# Patient Record
Sex: Female | Born: 1965 | Race: White | Hispanic: No | Marital: Married | Smoking: Former smoker
Health system: Southern US, Community
[De-identification: ages and names within clinical notes are randomized; demographics above are authoritative.]

## PROBLEM LIST (undated history)

## (undated) DIAGNOSIS — E041 Nontoxic single thyroid nodule: Secondary | ICD-10-CM

## (undated) DIAGNOSIS — M549 Dorsalgia, unspecified: Secondary | ICD-10-CM

## (undated) DIAGNOSIS — Z5189 Encounter for other specified aftercare: Secondary | ICD-10-CM

## (undated) HISTORY — PX: TONSILLECTOMY: SUR1361

## (undated) HISTORY — PX: TUBAL LIGATION: SHX77

## (undated) HISTORY — PX: COSMETIC SURGERY: SHX468

## (undated) HISTORY — PX: LUMBAR SPINE SURGERY: SHX701

## (undated) HISTORY — PX: SPINE SURGERY: SHX786

## (undated) HISTORY — PX: DILATION AND CURETTAGE OF UTERUS: SHX78

## (undated) HISTORY — DX: Encounter for other specified aftercare: Z51.89

## (undated) HISTORY — PX: CERVICAL SPINE SURGERY: SHX589

---
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
## 2007-11-04 ENCOUNTER — Ambulatory Visit (HOSPITAL_COMMUNITY): Admission: RE | Admit: 2007-11-04 | Discharge: 2007-11-04 | Payer: Self-pay | Admitting: Obstetrics & Gynecology

## 2007-11-04 ENCOUNTER — Encounter (INDEPENDENT_AMBULATORY_CARE_PROVIDER_SITE_OTHER): Payer: Self-pay | Admitting: Obstetrics & Gynecology

## 2010-08-20 NOTE — Op Note (Signed)
NAMEBETTYLEE, Erica Franklin                ACCOUNT NO.:  [id number]   MEDICAL RECORD NO.:  [id number]          PATIENT TYPE:  AMB   LOCATION:  SDC                           FACILITY:  [facility]   PHYSICIAN:  Genia Del, M.D.DATE OF BIRTH:  October 22, 1965   DATE OF PROCEDURE:  11/04/2007  DATE OF DISCHARGE:                               OPERATIVE REPORT   PREOPERATIVE DIAGNOSES:  Menorrhagia and left vulvar lesion.   POSTOPERATIVE DIAGNOSES:  Menorrhagia and left vulvar lesion.   PROCEDURES:  Diagnostic hysteroscopy with dilatation and curettage,  NovaSure endometrial ablation, and excision of left vulvar lesion.   SURGEON:  Genia Del, MD   PROCEDURE:  Under MAC analgesia, the patient was in lithotomy position.  She was prepped with Betadine on the suprapubic vulvar and vaginal  areas.  A bladder catheterization was done.  The vaginal exam reveals an  anteverted uterus, normal volume, mobile, and no adnexal mass.  We  insert the speculum in the vagina.  The anterior lip of the cervix was  grasped with a tenaculum.  A paracervical block was done with Nesacaine  1% a total of 20 mL.  The hysterotomy was at 7 cm.  Given the small  uterine cavity the decision was made to proceed with diagnostic  hysteroscopy to assure that we evaluate the intrauterine cavity well.  We dilate the cervix at Hegar dilator #25 without difficulty.  We then  insert the diagnostic hysteroscope.  The cavity was regular and no  lesion was seen.  Pictures were taken.  The measurements of the uterine  cavity was achieved with the hysteroscope under direct vision.  The  cervical length was at 3 cm and the uterine cavity length was at 4 cm.  The hysteroscope was then removed.  We proceeded with a systematic  endometrial curettage with a sharp curette.  The specimen was sent to  pathology.  We then insert the NovaSure instrument inside the  intrauterine cavity.  We then make all the moved to get the cavity  width,  which was at 2.8 cm.  We then do the security test and the  cavities integrity was confirmed.  We proceed with the ablation, it was  done at 62 power for 1 minute and 30 seconds.  The instrument was then  easily removed.  We removed the tenaculum.  Hemostasis was completed at  that level with the silver nitrate sticks.  We then removed the  speculum.  We proceed with excision of the left vulvar lesion, it was a  pale raised lesion less than 1 cm in diameter.  We use the scalpel to  excise it.  It was sent to pathology and the skin was then close with  Vicryl 4-0 four stitches were used to complete the closure and  hemostasis.  Showed no complications occurred.  The fluid deficit was 70  mL.  The estimated blood loss was minimal.  The patient was brought to  recovery room in good stable status.      Genia Del, M.D.  Electronically Signed     ML/MEDQ  D:  11/04/2007  T:  11/04/2007  Job:  [id number]

## 2010-12-30 ENCOUNTER — Emergency Department (HOSPITAL_COMMUNITY): Payer: BC Managed Care – PPO

## 2010-12-30 ENCOUNTER — Emergency Department (HOSPITAL_COMMUNITY)
Admission: EM | Admit: 2010-12-30 | Discharge: 2010-12-30 | Disposition: A | Discharge status: Home / Self Care | Payer: BC Managed Care – PPO | Attending: Emergency Medicine | Admitting: Emergency Medicine

## 2010-12-30 DIAGNOSIS — M549 Dorsalgia, unspecified: Secondary | ICD-10-CM | POA: Insufficient documentation

## 2010-12-30 DIAGNOSIS — R109 Unspecified abdominal pain: Secondary | ICD-10-CM | POA: Insufficient documentation

## 2010-12-30 DIAGNOSIS — R319 Hematuria, unspecified: Secondary | ICD-10-CM | POA: Insufficient documentation

## 2010-12-30 LAB — URINALYSIS, ROUTINE W REFLEX MICROSCOPIC
Bilirubin Urine: NEGATIVE
Hgb urine dipstick: NEGATIVE
Specific Gravity, Urine: 1.006 (ref 1.005–1.030)
pH: 8 (ref 5.0–8.0)

## 2011-01-03 LAB — CBC
Hemoglobin: 11.5 — ABNORMAL LOW
MCHC: 33.4
MCV: 87.3
RBC: 3.95

## 2011-01-08 ENCOUNTER — Other Ambulatory Visit: Payer: Self-pay | Admitting: Family Medicine

## 2011-01-08 ENCOUNTER — Ambulatory Visit
Admission: RE | Admit: 2011-01-08 | Discharge: 2011-01-08 | Disposition: A | Discharge status: Home / Self Care | Payer: BC Managed Care – PPO | Source: Ambulatory Visit | Attending: Family Medicine | Admitting: Family Medicine

## 2011-01-08 DIAGNOSIS — M5416 Radiculopathy, lumbar region: Secondary | ICD-10-CM

## 2011-01-08 DIAGNOSIS — M549 Dorsalgia, unspecified: Secondary | ICD-10-CM

## 2011-01-20 ENCOUNTER — Other Ambulatory Visit: Payer: Self-pay | Admitting: Family Medicine

## 2011-01-20 DIAGNOSIS — M545 Low back pain: Secondary | ICD-10-CM

## 2011-01-23 ENCOUNTER — Ambulatory Visit
Admission: RE | Admit: 2011-01-23 | Discharge: 2011-01-23 | Disposition: A | Discharge status: Home / Self Care | Payer: BC Managed Care – PPO | Source: Ambulatory Visit | Attending: Family Medicine | Admitting: Family Medicine

## 2011-01-23 DIAGNOSIS — M545 Low back pain: Secondary | ICD-10-CM

## 2011-02-05 ENCOUNTER — Other Ambulatory Visit: Payer: Self-pay | Admitting: Neurological Surgery

## 2011-02-05 ENCOUNTER — Ambulatory Visit
Admission: RE | Admit: 2011-02-05 | Discharge: 2011-02-05 | Disposition: A | Discharge status: Home / Self Care | Payer: BC Managed Care – PPO | Source: Ambulatory Visit | Attending: Neurological Surgery | Admitting: Neurological Surgery

## 2011-02-05 DIAGNOSIS — M545 Low back pain: Secondary | ICD-10-CM

## 2011-02-06 ENCOUNTER — Other Ambulatory Visit: Payer: Self-pay | Admitting: Neurological Surgery

## 2011-02-06 DIAGNOSIS — M549 Dorsalgia, unspecified: Secondary | ICD-10-CM

## 2011-02-14 ENCOUNTER — Ambulatory Visit
Admission: RE | Admit: 2011-02-14 | Discharge: 2011-02-14 | Disposition: A | Discharge status: Home / Self Care | Payer: BC Managed Care – PPO | Source: Ambulatory Visit | Attending: Neurological Surgery | Admitting: Neurological Surgery

## 2011-02-14 DIAGNOSIS — M549 Dorsalgia, unspecified: Secondary | ICD-10-CM

## 2011-02-14 MED ORDER — IOHEXOL 180 MG/ML  SOLN
15.0000 mL | Freq: Once | INTRAMUSCULAR | Status: AC | PRN
  Administered 2011-02-14: 15 mL via INTRATHECAL

## 2011-02-14 MED ORDER — MEPERIDINE HCL 100 MG/ML IJ SOLN: 75.0000 mg | Freq: Once | INTRAMUSCULAR | Status: DC

## 2011-02-14 MED ORDER — DIAZEPAM 5 MG PO TABS
10.0000 mg | ORAL_TABLET | Freq: Once | ORAL | Status: AC
  Administered 2011-02-14: 10 mg via ORAL

## 2011-02-14 MED ORDER — ONDANSETRON HCL 4 MG/2ML IJ SOLN: 4.0000 mg | Freq: Once | INTRAMUSCULAR | Status: DC

## 2011-02-14 NOTE — Progress Notes (Signed)
Pt is in less pain since pain meds given

## 2011-02-14 NOTE — Patient Instructions (Signed)

## 2011-08-08 ENCOUNTER — Other Ambulatory Visit: Payer: Self-pay | Admitting: Orthopedic Surgery

## 2011-08-08 DIAGNOSIS — M545 Low back pain, unspecified: Secondary | ICD-10-CM

## 2011-08-12 ENCOUNTER — Encounter (HOSPITAL_COMMUNITY): Payer: Self-pay | Admitting: *Deleted

## 2011-08-12 ENCOUNTER — Emergency Department (HOSPITAL_COMMUNITY)
Admission: EM | Admit: 2011-08-12 | Discharge: 2011-08-12 | Disposition: A | Discharge status: Home / Self Care | Payer: BC Managed Care – PPO | Attending: Emergency Medicine | Admitting: Emergency Medicine

## 2011-08-12 DIAGNOSIS — M5430 Sciatica, unspecified side: Secondary | ICD-10-CM | POA: Insufficient documentation

## 2011-08-12 DIAGNOSIS — G8929 Other chronic pain: Secondary | ICD-10-CM | POA: Insufficient documentation

## 2011-08-12 DIAGNOSIS — M549 Dorsalgia, unspecified: Secondary | ICD-10-CM | POA: Insufficient documentation

## 2011-08-12 DIAGNOSIS — M5431 Sciatica, right side: Secondary | ICD-10-CM

## 2011-08-12 HISTORY — DX: Dorsalgia, unspecified: M54.9

## 2011-08-12 MED ORDER — IBUPROFEN 800 MG PO TABS: 800.0000 mg | ORAL_TABLET | Freq: Three times a day (TID) | ORAL | Status: AC

## 2011-08-12 MED ORDER — HYDROMORPHONE HCL PF 1 MG/ML IJ SOLN
1.0000 mg | Freq: Once | INTRAMUSCULAR | Status: AC
  Administered 2011-08-12: 1 mg via INTRAMUSCULAR
  Filled 2011-08-12: qty 1

## 2011-08-12 MED ORDER — ONDANSETRON 4 MG PO TBDP
8.0000 mg | ORAL_TABLET | Freq: Once | ORAL | Status: AC
  Administered 2011-08-12: 8 mg via ORAL
  Filled 2011-08-12: qty 2

## 2011-08-12 MED ORDER — PREDNISONE 20 MG PO TABS: ORAL_TABLET | ORAL | Status: AC

## 2011-08-12 NOTE — Discharge Instructions (Signed)

## 2011-08-12 NOTE — ED Notes (Signed)
Patient reports she has had back pain chronically.  She reports the only thing that relieves her pain in a cortisone injections.  She had recent MRI 2 days ago.  She states her home pain medications are not working.  Patient states she has intermittent periods of her extremities going to sleep.  Patient has reported this concern to her orthopedic md.  ERPA is now at the bedside

## 2011-08-12 NOTE — ED Notes (Signed)
erpa reviewing chart prior to ordering pain medications

## 2011-08-12 NOTE — ED Notes (Signed)
Patient with complaints of nausea that she reports she has been experiencing due to pain.  erpa aware,  Medication given

## 2011-08-12 NOTE — ED Provider Notes (Signed)
History     CSN: [id number]  Arrival date & time 08/12/11  4098   First MD Initiated Contact with Patient 08/12/11 [phone]      Chief Complaint  Patient presents with  . Back Pain    (Consider location/radiation/quality/duration/timing/severity/associated sxs/prior treatment) HPI  46 year old female with history of chronic back pain secondary to degenerative disc disease presents with worsening back pain.  Pain of low back pain which radiates down her right leg. The gradual onset, persistent, worsening with laying on the right side and with walking. Pain improves with steroid shot. Denies fever, rash, bowel incontinence, urinary retention, or cauda equina symptoms. Associates with tingling sensation to both legs.   Been taking her home pain medication including Norco, and Skelaxin without adequate relief. Patient states she is currently being treated by Dr. Shon Baton. She has been receiving cortisone injection to her back, last injection was in December. She is here requesting for cortisone shot.  Sts she has been seen by Dr. Shon Baton recently for same. Sts he plan for her to have a discogram or decompression surgical procedure but her insurance does not cover it.  However, in the mean time her pain persist, not relief by home medication.    Schedule L. spine CT without contrast ordered by Dr. Shon Baton to be done on May 10th.    Past Medical History  Diagnosis Date  . Back pain     Past Surgical History  Procedure Date  . Cervical spine surgery     No family history on file.  History  Substance Use Topics  . Smoking status: Never Smoker   . Smokeless tobacco: Not on file  . Alcohol Use: No    OB History    Grav Para Term Preterm Abortions TAB SAB Ect Mult Living                  Review of Systems  All other systems reviewed and are negative.    Allergies  Codeine  Home Medications   Current Outpatient Rx  Name Route Sig Dispense Refill  . ASPIRIN-ACETAMINOPHEN-CAFFEINE  250-250-65 MG PO TABS Oral Take 2 tablets by mouth every 6 (six) hours as needed. For migraines.    Marland Kitchen HYDROCODONE-ACETAMINOPHEN 5-325 MG PO TABS Oral Take 1 tablet by mouth 3 (three) times daily as needed. For pain.    Marland Kitchen METAXALONE 800 MG PO TABS Oral Take 400 mg by mouth 3 (three) times daily as needed. For muscle spasms.      BP 133/79  Pulse 79  Temp(Src) 97.8 F (36.6 C) (Oral)  Resp 20  Ht 5\' 4"  (1.626 m)  Wt 157 lb (71.215 kg)  BMI 26.95 kg/m2  SpO2 100%  Physical Exam  Nursing note and vitals reviewed. Constitutional: She appears well-developed and well-nourished. No distress.       Awake, alert, nontoxic appearance  HENT:  Head: Atraumatic.  Eyes: Conjunctivae are normal. Right eye exhibits no discharge. Left eye exhibits no discharge.  Neck: Neck supple.  Cardiovascular: Normal rate and regular rhythm.   Pulmonary/Chest: Effort normal. No respiratory distress. She exhibits no tenderness.  Abdominal: Soft. There is no tenderness. There is no rebound.  Musculoskeletal: She exhibits no tenderness.       Cervical back: Normal.       Thoracic back: Normal.       Lumbar back: She exhibits decreased range of motion, tenderness and bony tenderness. She exhibits no swelling, no edema, no deformity and no laceration.  ROM appears intact, no obvious focal weakness  Neurological: She has normal strength. No sensory deficit. She displays a negative Romberg sign.  Reflex Scores:      Patellar reflexes are 3+ on the right side and 3+ on the left side.      Mental status and motor strength appears intact  Skin: No rash noted.  Psychiatric: She has a normal mood and affect.    ED Course  Procedures (including critical care time)  Labs Reviewed - No data to display No results found.   No diagnosis found.    MDM  Chronic back pain, requesting steroid shot.  Pt has no redflags finding.  No foot drops.  Pain worsening with straight leg raise >45 degrees.  Is afebrile.  I  have spoken with Dr. Shon Baton' PA via phone, who agrees to have pt receive treatment here in ED and for pt to call for f/u appointment.  Pt voice understanding.  Will provide treatment for L sided sciatica.  Pt has a scheduled Lspine CT w/out contrast on May 10th.          Fayrene Helper, PA-C 08/12/11 [phone]

## 2011-08-12 NOTE — ED Notes (Signed)
Patient reports she has degenerative disk diseases,  She is being treated for her l4 and l5 by Dr Shon Baton.  Her last cortisone injection was in December.  She states she thinks she needs a cortisone shot.  Patient has taken her home pain medications w/o relief.  Patient states her pain is radiating down into her right leg.  She reports Dr Shon Baton states her disk are shifted to the left and cannot explain her right leg pain.  Patient was able to ambulate into triage with diff due to pain.  Husband is at bedside.

## 2011-08-13 NOTE — ED Provider Notes (Signed)
Medical screening examination/treatment/procedure(s) were performed by non-physician practitioner and as supervising physician I was immediately available for consultation/collaboration.    Nelia Shi, MD 08/13/11 [phone]

## 2011-08-15 ENCOUNTER — Other Ambulatory Visit: Payer: BC Managed Care – PPO

## 2011-12-06 IMAGING — CT CT L SPINE W/ CM
4 of 10 series · 12 of 33 positions shown, 14 images · IV contrast (omnipaque)
Comparison: MRI lumbar spine 01/23/2011

MYELOGRAM INJECTION
TECHNIQUE: Informed consent was obtained from the patient prior to
the procedure, including potential complications of headache,
allergy, infection and pain. Specific instructions were given
regarding 24 hour bedrest post procedure to prevent post-LP
headache.  A timeout procedure was performed.  With the patient
prone, the lower back was prepped with Betadine.  1% Lidocaine was
used for local anesthesia.  Lumbar puncture was performed by the
radiologist at the L3-L4 level using a 22 gauge needle with return
of clear CSF.  15 cc of Omnipaque 180 was injected into the
subarachnoid space .
CLINICAL DATA: Severe right hip pain beginning after vigorous
exercise.
TECHNIQUE: Multidetector CT imaging of the lumbar spine was
performed following myelography.  Multiplanar CT image
reconstructions were also generated.

[Series 2: l spine bone · axial · 0.27mm/px · z∈[-66,+6]mm · 2 of 87 slices shown, 3 images]
[im 29/87  soft-tissue]
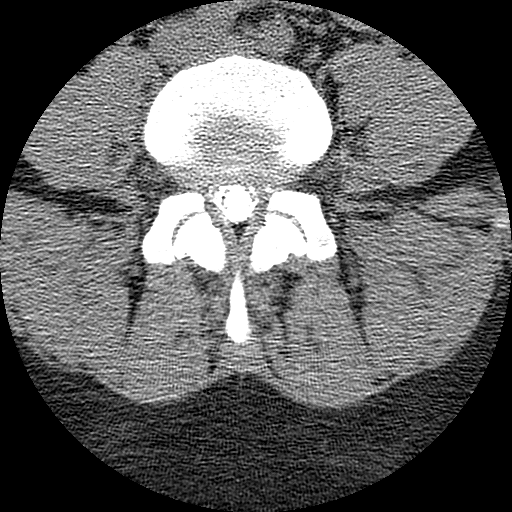
[im 29/87  bone]
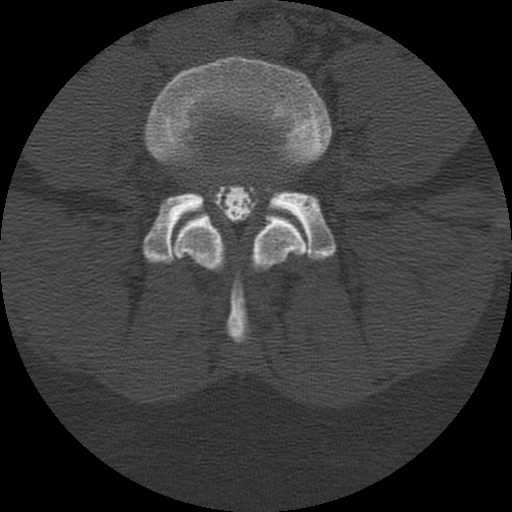
[im 58/87  bone]
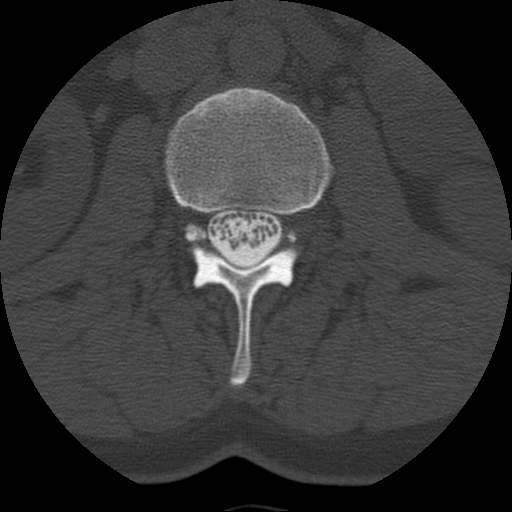

[Series 3: l spine soft · axial · 0.27mm/px · z∈[-66,+6]mm · 2 of 87 slices shown]
[im 29/87  soft-tissue]
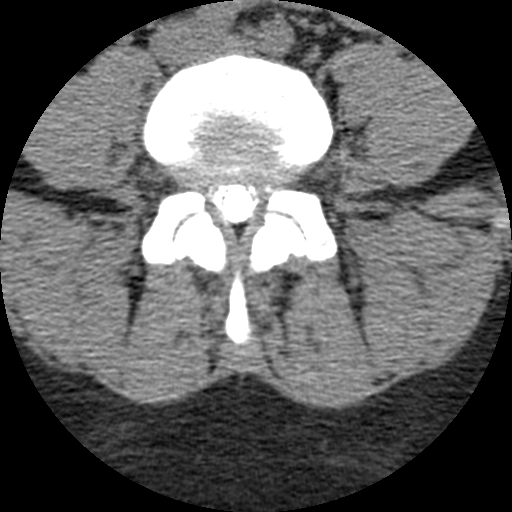
[im 58/87  soft-tissue]
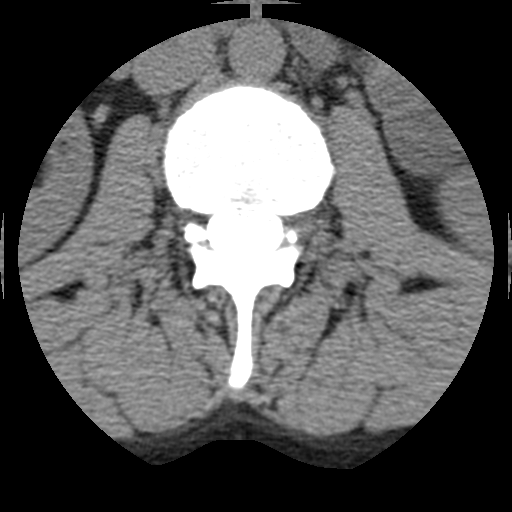

[Series 400: cor · coronal · 0.43mm/px · 3 of 50 slices shown]
[im 10/50  bone]
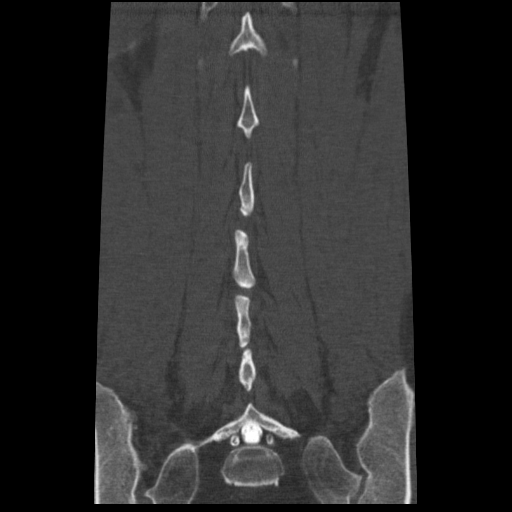
[im 20/50  bone]
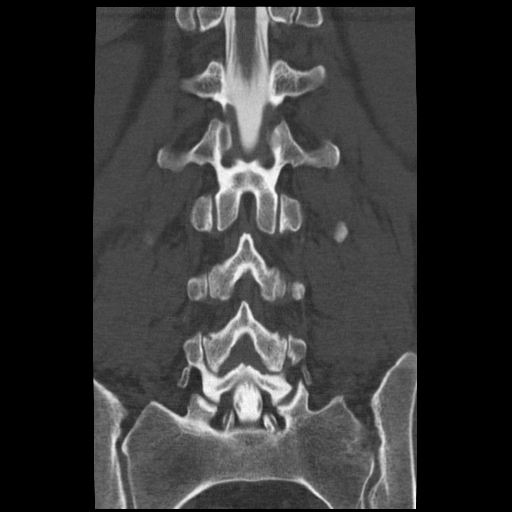
[im 30/50  bone]
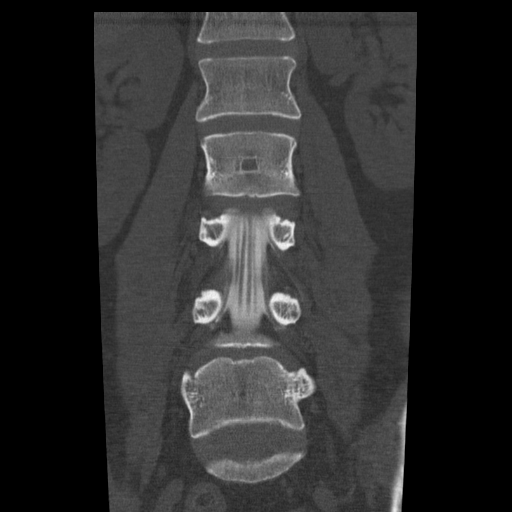

[Series 401: sag · sagittal · 0.43mm/px · 5 of 40 slices shown, 6 images]
[im 14/40  bone]
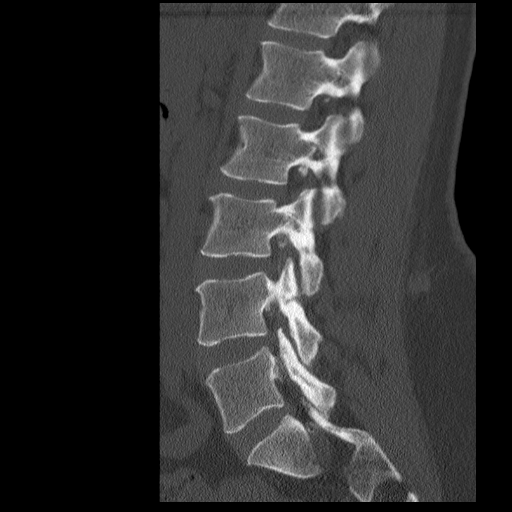
[im 17/40  bone]
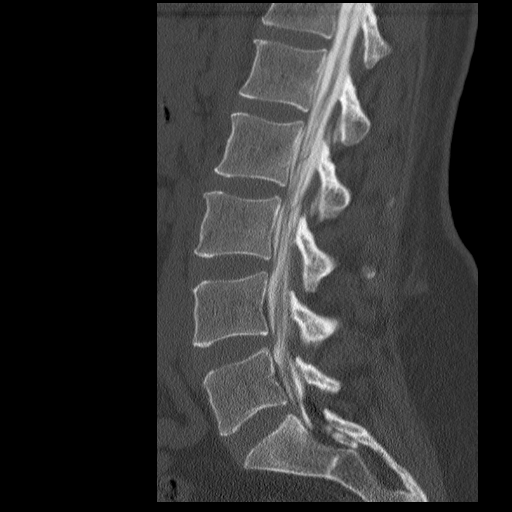
[im 20/40  soft-tissue]
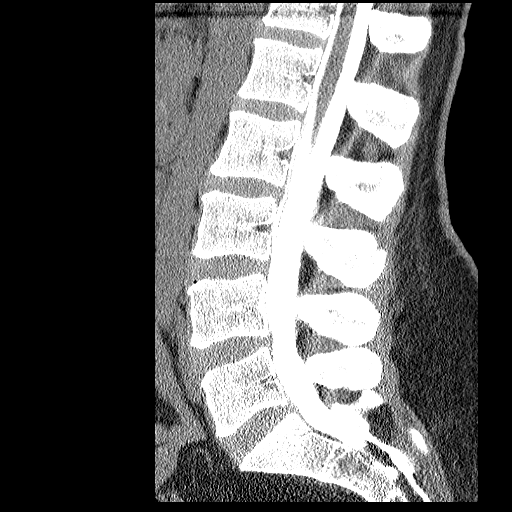
[im 20/40  bone]
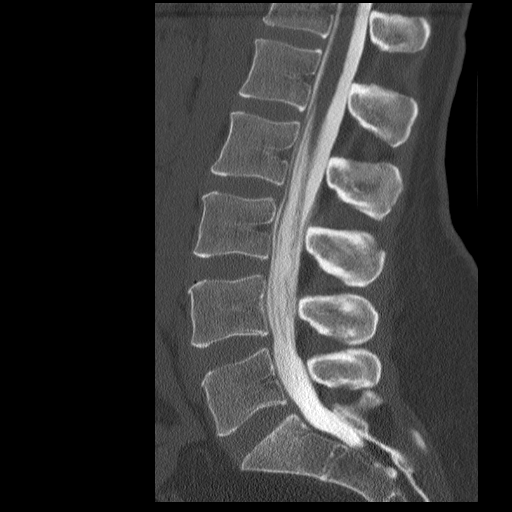
[im 23/40  bone]
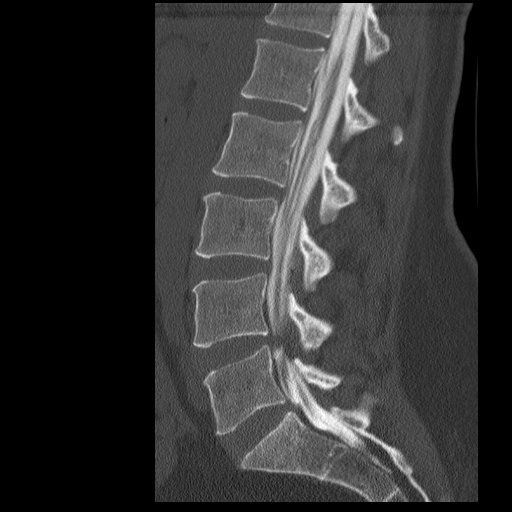
[im 27/40  bone]
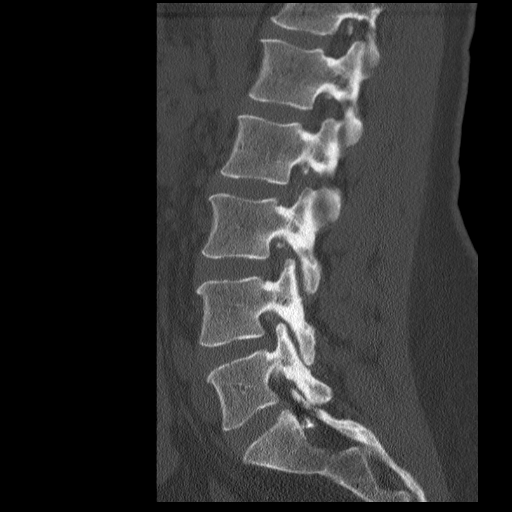

[12 of 33 positions shown; findings below may reference images not displayed]

IMPRESSION: Successful injection of  intrathecal contrast for myelography.

MYELOGRAM LUMBAR
FINDINGS: Good opacification lumbar subarachnoid space.  Anatomic
alignment.  Intervertebral disc spaces are preserved.  Left greater
than right L5 nerve root encroachment due to lateral recess
stenosis.  This is mildly exacerbated with the patient standing.
There is a shallow ventral defect at the L4-L5 level.  No dynamic
instability can be seen with standing flexion/extension views.

Fluoroscopy Time: 1.56 minutes
IMPRESSION: As above.

CT MYELOGRAPHY LUMBAR SPINE
FINDINGS: No prevertebral or paraspinous masses.

L1-2: Normal.

L2-3: Normal.

L3-4: Normal.

L4-5: Mild facet arthropathy.  Mild annular bulging.  Left greater
than right lateral recess stenosis.  Mild left and minimal right L5
nerve root effacement.  No significant foraminal narrowing.

L5-S1: Normal.
IMPRESSION: No dominant right-sided abnormality is seen.  There is mild lumbar
spondylosis at L4-L5 with central bulging annular fibers and left
greater than right facet arthropathy; there is left greater than
right L5 nerve root effacement at this level.

## 2012-06-13 ENCOUNTER — Ambulatory Visit (INDEPENDENT_AMBULATORY_CARE_PROVIDER_SITE_OTHER): Payer: BC Managed Care – PPO | Admitting: Physician Assistant

## 2012-06-13 VITALS — BP 122/72 | HR 81 | Temp 97.6°F | Resp 16 | Ht 65.0 in | Wt 152.0 lb

## 2012-06-13 DIAGNOSIS — R05 Cough: Secondary | ICD-10-CM

## 2012-06-13 MED ORDER — IPRATROPIUM BROMIDE 0.03 % NA SOLN: 2.0000 | Freq: Two times a day (BID) | NASAL | Status: DC

## 2012-06-13 MED ORDER — GUAIFENESIN ER 1200 MG PO TB12: 1.0000 | ORAL_TABLET | Freq: Two times a day (BID) | ORAL | Status: DC | PRN

## 2012-06-13 MED ORDER — HYDROCOD POLST-CHLORPHEN POLST 10-8 MG/5ML PO LQCR: 5.0000 mL | Freq: Two times a day (BID) | ORAL | Status: DC | PRN

## 2012-06-13 MED ORDER — AMOXICILLIN 875 MG PO TABS: 875.0000 mg | ORAL_TABLET | Freq: Two times a day (BID) | ORAL | Status: DC

## 2012-06-13 NOTE — Patient Instructions (Addendum)
Increase fluid intake to 64 oz per day for hydration.  Take Tussionex at night because it will make you drowsy.  If you need a less sedating cough medicine call us and we will change it for you.  Use an over the counter antihistamine such as Zyrtec or Claritin.  Complete antibiotic until all pills are gone even if you start feeling better sooner.  If your symptoms worsen or persist return to the clinic for re-evaluation.

## 2012-06-13 NOTE — Progress Notes (Signed)
Subjective:    Patient ID: Erica Franklin, female    DOB: Jan 15, 1966, 47 y.o.   MRN: [id number]  HPI  A 47 year old female presents with cough for 5 days.  Pt stated was near baby with RSV 1 week ago and then developed a fever and cough on Wednesday 06/09/12.  Pt had Tmax of 99.0 on Thursday 06/10/12.  Her cough has gradually worsened and she has clear mucus production.  Her cough is aggravated by lying down and she is unable to sleep at night.  Admits to SOB with walking, CP with coughing, nasal congestion with yellow drainage, sore throat, post nasal drip, otalgia, jaw pain, and wheeze.  Denies hx of asthma, n/v, HA.  She has tried Sudafed, Nyquil, OTC nasal spray, and cough drops without relief.  She is allergic to Codeine but says she tolerates Hydrocodone well.    Past Medical History  Diagnosis Date  . Back pain   . Blood transfusion without reported diagnosis     Past Surgical History  Procedure Laterality Date  . Cervical spine surgery    . Cosmetic surgery    . Cesarean section    . Spine surgery    . Tubal ligation    . Tonsillectomy Bilateral age 56    Prior to Admission medications   Medication Sig Start Date End Date Taking? Authorizing Provider  gabapentin (NEURONTIN) 100 MG capsule Take 100 mg by mouth 3 (three) times daily.   Yes Historical Provider, MD  topiramate (TOPAMAX) 50 MG tablet Take 50 mg by mouth 2 (two) times daily.   Yes Historical Provider, MD    Allergies  Allergen Reactions  . Codeine Hives    History   Social History  . Marital Status: Married    Spouse Name: Alinda Money    Number of Children: 3  . Years of Education: N/A   Occupational History  . Accountant    Social History Main Topics  . Smoking status: Former Smoker    Quit date: 08/06/2006  . Smokeless tobacco: Not on file  . Alcohol Use: No  . Drug Use: No  . Sexually Active: Yes    Birth Control/ Protection: Surgical   Other Topics Concern  . Not on file   Social History  Narrative  . No narrative on file    Family History  Problem Relation Age of Onset  . Cancer Maternal Grandmother 60    thyroid  . Heart attack Maternal Grandfather     MI age 41  . Arthritis Paternal Grandmother   . Heart disease Paternal Grandfather     Review of Systems   As above.  Objective:   Physical Exam  BP 122/72  Pulse 81  Temp(Src) 97.6 F (36.4 C) (Oral)  Resp 16  Ht 5\' 5"  (1.651 m)  Wt 152 lb (68.947 kg)  BMI 25.29 kg/m2  SpO2 99%   General:  Pleasant, well-nourished female.  NAD. HEENT:  NCAT.  Conjunctiva clear.  Nasal congestion with yellow drainage.  Erythema and edema turbinates.  No erythema or bulging of TMs, landmarks visible.  Pharynx moist and pink.  Negative frontal and maxillary tenderness. Neck:  Supple.  Tonsillar lymphadenopathy. Heart:  RRR.  Normal S1,S2.  No m/g/r.   Lungs:  Slightly decreased breath sounds.  No wheeze, rhonchi, or rales. Neuro:  A&Ox3.  Cranial nerves intact. Psych:  Normal mood and affect.      Assessment & Plan:  Sinusitis - Plan: amoxicillin (AMOXIL) 875 MG  tablet, ipratropium (ATROVENT) 0.03 % nasal spray, Guaifenesin (MUCINEX MAXIMUM STRENGTH) 1200 MG TB12  Cough - Plan: chlorpheniramine-HYDROcodone (TUSSIONEX PENNKINETIC ER) 10-8 MG/5ML [facility]  Patient Instructions  Increase fluid intake to 64 oz per day for hydration.  Take Tussionex at night because it will make you drowsy.  If you need a less sedating cough medicine call us and we will change it for you.  Use an over the counter antihistamine such as Zyrtec or Claritin.  Complete antibiotic until all pills are gone even if you start feeling better sooner.  If your symptoms worsen or persist return to the clinic for re-evaluation.

## 2012-06-14 NOTE — Progress Notes (Signed)
I have examined this patient along with the student and agree. Sidra Oldfield S. Vuong Musa, PA-C Certified Physician Assistant [facility]  

## 2012-10-25 ENCOUNTER — Encounter: Payer: Self-pay | Admitting: Obstetrics & Gynecology

## 2013-11-29 ENCOUNTER — Other Ambulatory Visit: Payer: Self-pay | Admitting: Rehabilitation

## 2013-11-29 DIAGNOSIS — M545 Low back pain, unspecified: Secondary | ICD-10-CM

## 2013-12-08 ENCOUNTER — Ambulatory Visit
Admission: RE | Admit: 2013-12-08 | Discharge: 2013-12-08 | Disposition: A | Discharge status: Home / Self Care | Payer: BC Managed Care – PPO | Source: Ambulatory Visit | Attending: Rehabilitation | Admitting: Rehabilitation

## 2013-12-08 DIAGNOSIS — M545 Low back pain, unspecified: Secondary | ICD-10-CM

## 2014-05-22 ENCOUNTER — Encounter (HOSPITAL_COMMUNITY): Payer: Self-pay | Admitting: *Deleted

## 2014-05-22 ENCOUNTER — Emergency Department (HOSPITAL_COMMUNITY)
Admission: EM | Admit: 2014-05-22 | Discharge: 2014-05-22 | Disposition: A | Discharge status: Home / Self Care | Payer: BLUE CROSS/BLUE SHIELD | Attending: Emergency Medicine | Admitting: Emergency Medicine

## 2014-05-22 DIAGNOSIS — F1193 Opioid use, unspecified with withdrawal: Secondary | ICD-10-CM | POA: Insufficient documentation

## 2014-05-22 DIAGNOSIS — F1123 Opioid dependence with withdrawal: Secondary | ICD-10-CM

## 2014-05-22 DIAGNOSIS — F419 Anxiety disorder, unspecified: Secondary | ICD-10-CM | POA: Insufficient documentation

## 2014-05-22 DIAGNOSIS — Z87891 Personal history of nicotine dependence: Secondary | ICD-10-CM | POA: Diagnosis not present

## 2014-05-22 DIAGNOSIS — Z792 Long term (current) use of antibiotics: Secondary | ICD-10-CM | POA: Insufficient documentation

## 2014-05-22 DIAGNOSIS — Z79899 Other long term (current) drug therapy: Secondary | ICD-10-CM | POA: Insufficient documentation

## 2014-05-22 DIAGNOSIS — G8929 Other chronic pain: Secondary | ICD-10-CM | POA: Diagnosis not present

## 2014-05-22 NOTE — ED Notes (Addendum)
Patient presents stating that she feels like she is having withdrawals from her Gabapentin.  Has been on Gabapentin for 3 years 3 times a day for her back pain. Had back surgery 02/13/2014 and states she does not have the nerve pain any more and felt like she did not want to take it anymore.  Also has been trying to get off the Vicodin.

## 2014-05-22 NOTE — ED Notes (Signed)
Patient is very anxious, feels like her blood is boiling, shaky

## 2014-05-22 NOTE — Discharge Instructions (Signed)
Continue taking your tizanidine as directed.  Take your Gabapentin 300 mg (one half tablet), three times daily for the next week.  Discontinue your hydrocodone.    Opioid Withdrawal Opioids are a group of narcotic drugs. They include the street drug heroin. They also include pain medicines, such as morphine, hydrocodone, oxycodone, and fentanyl. Opioid withdrawal is a group of characteristic physical and mental signs and symptoms. It typically occurs if you have been using opioids daily for several weeks or longer and stop using or rapidly decrease use. Opioid withdrawal can also occur if you have used opioids daily for a long time and are given a medicine to block the effect.  SIGNS AND SYMPTOMS Opioid withdrawal includes three or more of the following symptoms:   Depressed, anxious, or irritable mood.  Nausea or vomiting.  Muscle aches or spasms.   Watery eyes.   Runny nose.  Dilated pupils, sweating, or hairs standing on end.  Diarrhea or intestinal cramping.  Yawning.   Fever.  Increased blood pressure.  Fast pulse.  Restlessness or trouble sleeping. These signs and symptoms occur within several hours of stopping or reducing short-acting opioids, such as heroin. They can occur within 3 days of stopping or reducing long-acting opioids, such as methadone. Withdrawal begins within minutes of receiving a drug that blocks the effects of opioids, such as naltrexone or naloxone. DIAGNOSIS  Opioid use disorder is diagnosed by your health care provider. You will be asked about your symptoms, drug and alcohol use, medical history, and use of medicines. A physical exam may be done. Lab tests may be ordered. Your health care provider may have you see a mental health professional.  TREATMENT  The treatment for opioid withdrawal is usually provided by medical doctors with special training in substance use disorders (addiction specialists). The following medicines may be included in  treatment:  Opioids given in place of the abused opioid. They turn on opioid receptors in the brain and lessen or prevent withdrawal symptoms. They are gradually decreased (opioid substitution and taper).  Non-opioids that can lessen certain opioid withdrawal symptoms. They may be used alone or with opioid substitution and taper. Successful long-term recovery usually requires medicine, counseling, and group support. HOME CARE INSTRUCTIONS   Take medicines only as directed by your health care provider.  Check with your health care provider before starting new medicines.  Keep all follow-up visits as directed by your health care provider. SEEK MEDICAL CARE IF:  You are not able to take your medicines as directed.  Your symptoms get worse.  You relapse. SEEK IMMEDIATE MEDICAL CARE IF:  You have serious thoughts about hurting yourself or others.  You have a seizure.  You lose consciousness. Document Released: 03/27/2003 Document Revised: 08/08/2013 Document Reviewed: 04/06/2013 [facility] Patient Information 2015 [employer] [place]. This information is not intended to replace advice given to you by your health care provider. Make sure you discuss any questions you have with your health care provider.

## 2014-05-22 NOTE — ED Provider Notes (Signed)
CSN: [id number]     Arrival date & time 05/22/14  0445 History   First MD Initiated Contact with Patient 05/22/14 0451     Chief Complaint  Patient presents with  . Withdrawal      The history is provided by the patient. No language interpreter was used.   Ms. Buccellato presents for evaluation of possible withdrawls.  She has a hx/o chronic back pain and has been on gabapentin  TID for three years.  She had back surgery in November and since then she has been on vicodin and gabapentin.  She was concerned for addiction and has been trying to wean her medications lately.  Today she reports a few days of feeling like her blood is boiling but cold chills on the outside.  She has mild diarrhea and diffuse body aches. She denies any fever, nausea, vomiting, abdominal pain, chest pain, cough, dysuria.  She has chronic back pain and back spasms.  Two weeks ago she decreased her gabapentin to 600 mg bid for a week followed by 600 mg daily for a week.  She was taking norco 10, 2-3 times daily since November, but two days ago she decreased it to  2-3 times daily.  Sxs are moderate, constant, worsening.    Past Medical History  Diagnosis Date  . Back pain   . Blood transfusion without reported diagnosis    Past Surgical History  Procedure Laterality Date  . Cervical spine surgery    . Cosmetic surgery    . Cesarean section    . Spine surgery    . Tubal ligation    . Tonsillectomy Bilateral age 67   Family History  Problem Relation Age of Onset  . Cancer Maternal Grandmother 60    thyroid  . Heart attack Maternal Grandfather     MI age 74  . Arthritis Paternal Grandmother   . Heart disease Paternal Grandfather    History  Substance Use Topics  . Smoking status: Former Smoker    Quit date: 08/06/2006  . Smokeless tobacco: Never Used  . Alcohol Use: No   OB History    No data available     Review of Systems  All other systems reviewed and are negative.     Allergies   Codeine  Home Medications   Prior to Admission medications   Medication Sig Start Date End Date Taking? Authorizing Provider  amoxicillin (AMOXIL) 875 MG tablet Take 1 tablet (875 mg total) by mouth 2 (two) times daily. 06/13/12   Chelle Tessa Lerner, PA-C  chlorpheniramine-HYDROcodone (TUSSIONEX PENNKINETIC ER) 10-8 MG/5ML LQCR Take 5 mLs by mouth every 12 (twelve) hours as needed (cough). 06/13/12   Chelle S Jeffery, PA-C  gabapentin (NEURONTIN) 100 MG capsule Take 100 mg by mouth 3 (three) times daily.    Historical Provider, MD  Guaifenesin (MUCINEX MAXIMUM STRENGTH) 1200 MG TB12 Take 1 tablet (1,200 mg total) by mouth every 12 (twelve) hours as needed. 06/13/12   Chelle S Jeffery, PA-C  ipratropium (ATROVENT) 0.03 % nasal spray Place 2 sprays into the nose 2 (two) times daily. 06/13/12   Chelle S Jeffery, PA-C  topiramate (TOPAMAX) 50 MG tablet Take 50 mg by mouth 2 (two) times daily.    Historical Provider, MD   BP 118/60 mmHg  Pulse 69  Temp(Src) 97.9 F (36.6 C) (Oral)  Resp 11  Ht  (1.626 m)  Wt 170 lb (77.111 kg)  BMI 29.17 kg/m2  SpO2 98% Physical Exam  Constitutional: She is oriented to person, place, and time. She appears well-developed and well-nourished.  HENT:  Head: Normocephalic and atraumatic.  Cardiovascular: Normal rate and regular rhythm.   No murmur heard. Pulmonary/Chest: Effort normal and breath sounds normal. No respiratory distress.  Abdominal: Soft. There is no tenderness. There is no rebound and no guarding.  Musculoskeletal: She exhibits no edema or tenderness.  Neurological: She is alert and oriented to person, place, and time.  Skin: Skin is warm and dry.  Psychiatric:  anxious  Nursing note and vitals reviewed.   ED Course  Procedures (including critical care time) Labs Review Labs Reviewed - No data to display  Imaging Review No results found.   EKG Interpretation None      MDM   Final diagnoses:  Opioid withdrawal    Patient  presents for evaluation of withdrawal symptoms. She reports body aches, chills, diarrhea. She's recently decrease both her gabapentin as well as her Norco dosing. Patient is nontoxic appearing and in no distress in the emergency department. She is well-hydrated. Do not feel that laboratory evaluation this time will be of any benefit. Discussed with patient expectant management for opioid withdrawal. Discussed recommendation for withdrawing from one medication at time and reinitiating her gabapentin and a lower dose throughout the day until she completes withdrawing from the Daine GravelNorco.  Kamsiyochukwu Buist, MD 05/22/14 [phone]

## 2014-10-25 ENCOUNTER — Other Ambulatory Visit: Payer: Self-pay | Admitting: Physician Assistant

## 2015-03-19 ENCOUNTER — Other Ambulatory Visit: Payer: Self-pay | Admitting: Gastroenterology

## 2016-12-10 ENCOUNTER — Encounter: Payer: Self-pay | Admitting: Obstetrics & Gynecology

## 2017-06-03 ENCOUNTER — Other Ambulatory Visit: Payer: Self-pay

## 2017-06-03 MED ORDER — ESTRADIOL 10 MCG VA TABS: ORAL_TABLET | VAGINAL | 0 refills | Status: DC

## 2017-06-03 MED ORDER — ESTRADIOL 0.05 MG/24HR TD PTWK: 0.0500 mg | MEDICATED_PATCH | TRANSDERMAL | 0 refills | Status: DC

## 2017-06-03 NOTE — Telephone Encounter (Signed)
Patient scheduled CE for May 15 with Dr. Mackey BirchwoodML.

## 2017-06-05 ENCOUNTER — Telehealth: Payer: Self-pay

## 2017-06-05 MED ORDER — PROGESTERONE MICRONIZED 100 MG PO CAPS: ORAL_CAPSULE | ORAL | 0 refills | Status: DC

## 2017-06-05 NOTE — Telephone Encounter (Signed)
Not note needed. Med refill.

## 2017-08-19 ENCOUNTER — Encounter: Payer: Self-pay | Admitting: Obstetrics & Gynecology

## 2017-08-19 ENCOUNTER — Ambulatory Visit (INDEPENDENT_AMBULATORY_CARE_PROVIDER_SITE_OTHER): Payer: No Typology Code available for payment source | Admitting: Obstetrics & Gynecology

## 2017-08-19 VITALS — BP 104/68 | Ht 65.0 in | Wt 138.0 lb

## 2017-08-19 DIAGNOSIS — Z79890 Hormone replacement therapy (postmenopausal): Secondary | ICD-10-CM | POA: Diagnosis not present

## 2017-08-19 DIAGNOSIS — Z124 Encounter for screening for malignant neoplasm of cervix: Secondary | ICD-10-CM | POA: Diagnosis not present

## 2017-08-19 DIAGNOSIS — Z01419 Encounter for gynecological examination (general) (routine) without abnormal findings: Secondary | ICD-10-CM

## 2017-08-19 DIAGNOSIS — N952 Postmenopausal atrophic vaginitis: Secondary | ICD-10-CM

## 2017-08-19 MED ORDER — ESTRADIOL 0.5 MG PO TABS
0.5000 mg | ORAL_TABLET | Freq: Every day | ORAL | 4 refills | Status: DC
Start: 2017-08-19 — End: 2017-10-28

## 2017-08-19 MED ORDER — ESTRADIOL 10 MCG VA TABS: ORAL_TABLET | VAGINAL | 4 refills | Status: DC

## 2017-08-19 MED ORDER — PROGESTERONE MICRONIZED 100 MG PO CAPS: ORAL_CAPSULE | ORAL | 4 refills | Status: DC

## 2017-08-19 NOTE — Progress Notes (Signed)
Erica Franklin 30-Apr-1965 [id number]   History:    52 y.o. [id number] Married  RP:  Established patient presenting for annual gyn exam   HPI: S/P TL/Endometrial Ablation.  Menopause, well on HRT with Estradiol/Prometrium daily HS and Vagifem.  No PMB.  No pelvic pain.  Pain with IC helped with Vagifem.  Recommend Coconut oil as needed.  Urine/BMs wnl.  Breasts wnl.  BMI 22.96.  Health labs with Fam MD.  Past medical history,surgical history, family history and social history were all reviewed and documented in the EPIC chart.  Gynecologic History No LMP recorded. Patient has had an ablation. Contraception: tubal ligation Last Pap: 05/2015. Results were: Negative/HPV HR neg Last mammogram: 12/2016. Results were: Probably benign.  Left breast biopsy benign.  Breast ultrasound recommended at 6 months. Bone Density: Never Colonoscopy: 2016  Obstetric History OB History  Gravida Para Term Preterm AB Living  SAB TAB Ectopic Multiple Live Births  1   2        # Outcome Date GA Lbr Len/2nd Weight Sex Delivery Anes PTL Lv  6 SAB           5 Ectopic           4 Ectopic           3 Para           2 Para           1 Para              ROS: A ROS was performed and pertinent positives and negatives are included in the history.  GENERAL: No fevers or chills. HEENT: No change in vision, no earache, sore throat or sinus congestion. NECK: No pain or stiffness. CARDIOVASCULAR: No chest pain or pressure. No palpitations. PULMONARY: No shortness of breath, cough or wheeze. GASTROINTESTINAL: No abdominal pain, nausea, vomiting or diarrhea, melena or bright red blood per rectum. GENITOURINARY: No urinary frequency, urgency, hesitancy or dysuria. MUSCULOSKELETAL: No joint or muscle pain, no back pain, no recent trauma. DERMATOLOGIC: No rash, no itching, no lesions. ENDOCRINE: No polyuria, polydipsia, no heat or cold intolerance. No recent change in weight. HEMATOLOGICAL: No anemia or easy  bruising or bleeding. NEUROLOGIC: No headache, seizures, numbness, tingling or weakness. PSYCHIATRIC: No depression, no loss of interest in normal activity or change in sleep pattern.     Exam:   BP 104/68   Ht  (1.651 m)   Wt 138 lb (62.6 kg)   BMI 22.96 kg/m   Body mass index is 22.96 kg/m.  General appearance : Well developed well nourished female. No acute distress HEENT: Eyes: no retinal hemorrhage or exudates,  Neck supple, trachea midline, no carotid bruits, no thyroidmegaly Lungs: Clear to auscultation, no rhonchi or wheezes, or rib retractions  Heart: Regular rate and rhythm, no murmurs or gallops Breast:Examined in sitting and supine position were symmetrical in appearance, no palpable masses or tenderness,  no skin retraction, no nipple inversion, no nipple discharge, no skin discoloration, no axillary or supraclavicular lymphadenopathy Abdomen: no palpable masses or tenderness, no rebound or guarding Extremities: no edema or skin discoloration or tenderness  Pelvic: Vulva: Normal             Vagina: No gross lesions or discharge  Cervix: No gross lesions or discharge.  Pap reflex done.  Uterus  AV, normal size, shape and consistency, non-tender and mobile  Adnexa  Without masses  or tenderness  Anus: Normal   Assessment/Plan:  52 y.o. female for annual exam   1. Encounter for routine gynecological examination with Papanicolaou smear of cervix Normal gynecologic exam.  Pap reflex done.  Breast exam normal.  We will follow-up with mammograms as indicated at [facility].  Health labs with family physician.  Last colonoscopy 2016.  Recommend regular physical activity with aerobic activities 5 times a week and weightlifting every 2 days.  2. Post-menopause on HRT (hormone replacement therapy) Well on hormone replacement therapy.  No postmenopausal bleeding.  No contraindication to continue.  Estradiol 0.5 mg 1 tablet daily and Prometrium 100 mg daily at bedtime prescribed.   Usage, risks and benefits known to patient.  Regular weightbearing physical activity, vitamin D supplements and calcium rich nutrition recommended.  3. Post-menopause atrophic vaginitis Vagifem generic represcribed.  Also recommended coconut oil as needed.  Other orders - vitamin C (ASCORBIC ACID) 250 MG tablet; Take 250 mg by mouth daily. - cholecalciferol (VITAMIN D) 1000 units tablet; Take 1,000 Units by mouth daily. - Estradiol (YUVAFEM) 10 MCG TABS vaginal tablet; Insert one tab vaginally twice weekly. - progesterone (PROMETRIUM) 100 MG capsule; Take 1 capsule by mouth at bedtime. - estradiol (ESTRACE) 0.5 MG tablet; Take 1 tablet (0.5 mg total) by mouth daily.  Genia Del MD, 3:48 PM 08/19/2017

## 2017-08-21 ENCOUNTER — Encounter: Payer: Self-pay | Admitting: Obstetrics & Gynecology

## 2017-08-21 LAB — PAP IG W/ RFLX HPV ASCU

## 2017-08-21 NOTE — Patient Instructions (Signed)
1. Encounter for routine gynecological examination with Papanicolaou smear of cervix Normal gynecologic exam.  Pap reflex done.  Breast exam normal.  We will follow-up with mammograms as indicated at [facility].  Health labs with family physician.  Last colonoscopy 2016.  Recommend regular physical activity with aerobic activities 5 times a week and weightlifting every 2 days.  2. Post-menopause on HRT (hormone replacement therapy) Well on hormone replacement therapy.  No postmenopausal bleeding.  No contraindication to continue.  Estradiol 0.5 mg 1 tablet daily and Prometrium 100 mg daily at bedtime prescribed.  Usage, risks and benefits known to patient.  Regular weightbearing physical activity, vitamin D supplements and calcium rich nutrition recommended.  3. Post-menopause atrophic vaginitis Vagifem generic represcribed.  Also recommended coconut oil as needed.  Other orders - vitamin C (ASCORBIC ACID) 250 MG tablet; Take 250 mg by mouth daily. - cholecalciferol (VITAMIN D) 1000 units tablet; Take 1,000 Units by mouth daily. - Estradiol (YUVAFEM) 10 MCG TABS vaginal tablet; Insert one tab vaginally twice weekly. - progesterone (PROMETRIUM) 100 MG capsule; Take 1 capsule by mouth at bedtime. - estradiol (ESTRACE) 0.5 MG tablet; Take 1 tablet (0.5 mg total) by mouth daily.  Erica Franklin, it was a pleasure seeing you today!  I will inform you of your results as soon as they are available.

## 2017-09-07 ENCOUNTER — Other Ambulatory Visit: Payer: Self-pay | Admitting: Obstetrics & Gynecology

## 2017-10-19 ENCOUNTER — Telehealth: Payer: Self-pay | Admitting: *Deleted

## 2017-10-19 NOTE — Telephone Encounter (Signed)
Patient takes estradiol 0.5 mg tablet 1 po daily, told to call if she continues to have hot flashes so medication can be increased. She would like to increase estradiol. Please advise

## 2017-10-28 MED ORDER — ESTRADIOL 1 MG PO TABS: 1.0000 mg | ORAL_TABLET | Freq: Every day | ORAL | 3 refills | Status: DC

## 2017-10-28 NOTE — Telephone Encounter (Signed)
Agree with increasing to Estradiol 1 mg/tab daily.  Continue Prometrium 100 mg HS daily.

## 2017-10-28 NOTE — Telephone Encounter (Signed)
Patient aware, Rx sent.  

## 2017-11-30 ENCOUNTER — Encounter: Payer: Self-pay | Admitting: Obstetrics & Gynecology

## 2018-02-19 ENCOUNTER — Other Ambulatory Visit: Payer: Self-pay | Admitting: Nurse Practitioner

## 2018-02-19 ENCOUNTER — Ambulatory Visit
Admission: RE | Admit: 2018-02-19 | Discharge: 2018-02-19 | Disposition: A | Discharge status: Home / Self Care | Payer: BLUE CROSS/BLUE SHIELD | Source: Ambulatory Visit | Attending: Nurse Practitioner | Admitting: Nurse Practitioner

## 2018-02-19 DIAGNOSIS — M25512 Pain in left shoulder: Secondary | ICD-10-CM

## 2018-02-19 DIAGNOSIS — M542 Cervicalgia: Secondary | ICD-10-CM

## 2018-02-19 DIAGNOSIS — M25511 Pain in right shoulder: Secondary | ICD-10-CM

## 2018-02-19 DIAGNOSIS — R1313 Dysphagia, pharyngeal phase: Secondary | ICD-10-CM

## 2018-02-24 ENCOUNTER — Other Ambulatory Visit: Payer: BLUE CROSS/BLUE SHIELD

## 2018-08-06 DIAGNOSIS — E041 Nontoxic single thyroid nodule: Secondary | ICD-10-CM

## 2018-08-06 HISTORY — DX: Nontoxic single thyroid nodule: E04.1

## 2018-09-06 ENCOUNTER — Other Ambulatory Visit: Payer: Self-pay | Admitting: Obstetrics & Gynecology

## 2018-09-15 ENCOUNTER — Other Ambulatory Visit: Payer: Self-pay | Admitting: Internal Medicine

## 2018-09-15 DIAGNOSIS — E041 Nontoxic single thyroid nodule: Secondary | ICD-10-CM

## 2018-09-24 ENCOUNTER — Ambulatory Visit
Admission: RE | Admit: 2018-09-24 | Discharge: 2018-09-24 | Disposition: A | Discharge status: Home / Self Care | Payer: No Typology Code available for payment source | Source: Ambulatory Visit | Attending: Internal Medicine | Admitting: Internal Medicine

## 2018-09-24 DIAGNOSIS — E041 Nontoxic single thyroid nodule: Secondary | ICD-10-CM

## 2018-10-01 ENCOUNTER — Ambulatory Visit: Payer: Self-pay | Admitting: Orthopedic Surgery

## 2018-10-05 ENCOUNTER — Ambulatory Visit: Payer: Self-pay | Admitting: Orthopedic Surgery

## 2018-10-05 NOTE — H&P (Deleted)
  The note originally documented on this encounter has been moved the the encounter in which it belongs.  

## 2018-10-05 NOTE — H&P (Signed)
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 She is here today for a pre-operative History and Physical. They are scheduled for right sided approach ACDF C4-5 on 10-21-18 with Dr. 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 Her symptoms have failed to improve with conservative treatments to include time, Physical therapy and self directed exercise, OTC medications, and prescription medicaltions.  She has been evaluated by an ENT who saw no contraindications to right sided anterior approach. We have also obtained pre-operative clearance from his PCP. She has her ASPEN collar fitting and pre-op testing scheduled.  Past Medical History:  Diagnosis Date  . Back pain   . Blood transfusion without reported diagnosis     Past Surgical History:  Procedure Laterality Date  . CERVICAL SPINE SURGERY    . CESAREAN SECTION     x2  . COSMETIC SURGERY    . DILATION AND CURETTAGE OF UTERUS     2  . LUMBAR SPINE SURGERY    . SPINE SURGERY    . TONSILLECTOMY Bilateral age 49  . TUBAL LIGATION      Current Outpatient Medications  Medication Sig Dispense Refill Last Dose  . cholecalciferol (VITAMIN D) 1000 units tablet Take 1,000 Units by mouth daily.   Taking  . estradiol (ESTRACE) 1 MG tablet Take 1 tablet (1 mg total) by mouth daily. 90 tablet 3   . Estradiol (YUVAFEM) 10 MCG TABS vaginal tablet Insert one tab vaginally twice weekly. 24 tablet 4   . ibuprofen (ADVIL,MOTRIN) 800 MG tablet Take 800 mg by mouth 3 (three) times daily with meals.   Taking  . progesterone (PROMETRIUM) 100 MG capsule TAKE 1 CAPSULE BY MOUTH AT BEDTIME 90 capsule 3   . progesterone (PROMETRIUM) 100 MG capsule TAKE 1 CAPSULE BY MOUTH AT BEDTIME 90 capsule 0   . tiZANidine (ZANAFLEX) 4 MG tablet Take 4 mg by mouth every 6 (six) hours as needed for  muscle spasms.   Taking  . vitamin C (ASCORBIC ACID) 250 MG tablet Take 250 mg by mouth daily.   Taking   No current facility-administered medications for this visit.    Allergies  Allergen Reactions  . Codeine Hives    Social History   Tobacco Use  . Smoking status: Former Smoker    Quit date: 08/06/2006    Years since quitting: 12.1  . Smokeless tobacco: Never Used  Substance Use Topics  . Alcohol use: Yes    Comment: once a month- social     Family History  Problem Relation Age of Onset  . Cancer Maternal Grandmother 60       thyroid  . Heart attack Maternal Grandfather        MI age 71  . Arthritis Paternal Grandmother   . Heart disease Paternal Grandfather   . Breast cancer Maternal Aunt   . Breast cancer Cousin   . Breast cancer Cousin   . Breast cancer Cousin     Review of Systems As stated in HPI  Objective:   General: AAOX3, well developed and well nourished, NAD  Ambulation: normal gait pattern, uses no assistive device.  Inspection: No obvious deformity, scoliosis, kyphosis, loss of lordotic curve. Incision: Previous left sided anterior incision is well healed.  Heart: RRR, no rubs, murmers, or gallops  Lungs: CTAB  Abdome:  Normal BSX4, non-tender, non-distended, no hepatosplenomegaly.  Palpation: Tender over spinous processes and neck musculature.  AROM: -Shoulder, elbow, and wrists AROM normal and pain free.  Dermatomes: UE dermatomes abnormal to light touch bilaterally in C5 dermatome pattern.  Myotomes: - shoulder shrug: Left 5/5, Right 5/5 -Shoulder Abduction: Left 5/5, Right 5/5 - Elbow flexion: Left 5/5, Right 5/5 - Elbow extension Left 5/5, Right 5/5 - Wrist extension Left 5/5, Right 5/5 - Finger abduction: Left 5/5, Right 5/5 - finger Adduction/squeeze: Left 5/5, Right 5/5    Reflexes: - Biceps: Left1+, Right 1+ - Brachioradialius: Left1+, Right 1+ - Triceps: Left 1+, Right 1+ - Hoffman's: Negative  Special Tests: - UE  Neural tension test: Left Negative, Right Negative  PV: Extremities warm and well profused. Distal pulses 2+ bilaterally.    X-Ray impression: X-rays of the cervical spine demonstrate a solid C5-7 ACDF. There is some mild degenerative changes at the adjacent C4-5 level.  MRI Impression: Cervical MRI: completed on 09/08/18 was reviewed with the patient. I have also reviewed the radiology report. No significant complicating features at the ACDF surgical levels C5-6 C6-7. At C4/5 there is some moderate right foraminal stenosis and mild left foraminal stenosis. No cord signal changes.  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 She is here today for a pre-operative History and Physical. They are scheduled for right sided approach ACDF C4-5 on 10-21-18 with 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 Fortunately, she has no motor deficits nor does she have signs of myelopathy. Her symptoms have failed to improve with conservative treatments to include time, Pjhysical therapy and self directed exercise, OTC medications, and prescription medications.  Plan:   C4-5 ACDF via right sided anterior [facility]  Risks and benefits of surgery were discussed with the patient. These include: Infection, bleeding, death, stroke, paralysis, ongoing or worse pain, need for additional surgery, nonunion, leak of spinal fluid, adjacent segment degeneration requiring additional fusion surgery. Pseudoarthrosis (nonunion)requiring supplemental posterior fixation. Throat pain, swallowing difficulties, hoarseness or change in voice.  With respect to disc replacement: Additional risks include heterotopic ossification, inability to place the disc due to technical issues requiring bailout to a fusion  procedure.  We have also discussed the goals of surgery to include: Goals of surgery: Reduction in pain, and improvement in quality of life.  We have also discussed the post-operative recovery period to include: bathing/showering restrictions, wound healing, activity (and driving) restrictions, medications/pain management.  We have also discussed post-operative redflags to include: signs and symptoms of postoperative infection, DVT/PE.  All patients questions were invited and answered. She expressed a understanding of the risk of surgery, goals of surgery, what to expect in the postoperative period, and the postoperative red flags.  Follow-up: 2 weeks following surgery  Note Dictated by Glynis SmilesAmanda Ward PA-C, patient seen in conjuntion with Dr. Shon BatonBrooks.

## 2018-10-06 ENCOUNTER — Encounter: Payer: No Typology Code available for payment source | Admitting: Obstetrics & Gynecology

## 2018-10-06 ENCOUNTER — Other Ambulatory Visit: Payer: Self-pay

## 2018-10-06 ENCOUNTER — Ambulatory Visit (INDEPENDENT_AMBULATORY_CARE_PROVIDER_SITE_OTHER): Payer: No Typology Code available for payment source | Admitting: Obstetrics & Gynecology

## 2018-10-06 ENCOUNTER — Encounter: Payer: Self-pay | Admitting: Obstetrics & Gynecology

## 2018-10-06 VITALS — BP 126/78 | Ht 65.0 in | Wt 148.0 lb

## 2018-10-06 DIAGNOSIS — Z780 Asymptomatic menopausal state: Secondary | ICD-10-CM

## 2018-10-06 DIAGNOSIS — Z01419 Encounter for gynecological examination (general) (routine) without abnormal findings: Secondary | ICD-10-CM

## 2018-10-06 NOTE — Progress Notes (Signed)
Erica Franklin [birth year] [id number]   History:    53 y.o. [id number].  Married   RP:  Established patient presenting for annual gyn exam   HPI: S/P TL/Endometrial Ablation.  Postmenopause on HRT with Estradiol tab 1 mg daily and Prometrium 100 mg HS.  Will stop HRT prior to cervical spine surgery in about 2 weeks and doesn't want to start back after that.  No PMB.  No pelvic pain.  Stopped Vagifem and doing well with lubricant.  Urine/BMs normal.  Breasts normal.  BMI 24.63.  Good fitness and healthy nutrition.  Health Labs with Fam MD.  Past medical history,surgical history, family history and social history were all reviewed and documented in the EPIC chart.  Gynecologic History No LMP recorded. Patient has had an ablation. Contraception: tubal ligation Last Pap: 08/2017. Results were: Negative Last mammogram: 11/2017. Results were: Negative Bone Density: Per patient, previous BD Normal less than 5 yrs ago.  Will obtain report Colonoscopy: 2016  Obstetric History OB History  Gravida Para Term Preterm AB Living  6 3     3 3   SAB TAB Ectopic Multiple Live Births  1   2        # Outcome Date GA Lbr Len/2nd Weight Sex Delivery Anes PTL Lv  6 SAB           5 Ectopic           4 Ectopic           3 Para           2 Para           1 Para              ROS: A ROS was performed and pertinent positives and negatives are included in the history.  GENERAL: No fevers or chills. HEENT: No change in vision, no earache, sore throat or sinus congestion. NECK: No pain or stiffness. CARDIOVASCULAR: No chest pain or pressure. No palpitations. PULMONARY: No shortness of breath, cough or wheeze. GASTROINTESTINAL: No abdominal pain, nausea, vomiting or diarrhea, melena or bright red blood per rectum. GENITOURINARY: No urinary frequency, urgency, hesitancy or dysuria. MUSCULOSKELETAL: No joint or muscle pain, no back pain, no recent trauma. DERMATOLOGIC: No rash, no itching, no lesions. ENDOCRINE: No  polyuria, polydipsia, no heat or cold intolerance. No recent change in weight. HEMATOLOGICAL: No anemia or easy bruising or bleeding. NEUROLOGIC: No headache, seizures, numbness, tingling or weakness. PSYCHIATRIC: No depression, no loss of interest in normal activity or change in sleep pattern.     Exam:   BP 126/78   Ht 5\' 5"  (1.651 m)   Wt 148 lb (67.1 kg)   BMI 24.63 kg/m   Body mass index is 24.63 kg/m.  General appearance : Well developed well nourished female. No acute distress HEENT: Eyes: no retinal hemorrhage or exudates,  Neck supple, trachea midline, no carotid bruits, no thyroidmegaly Lungs: Clear to auscultation, no rhonchi or wheezes, or rib retractions  Heart: Regular rate and rhythm, no murmurs or gallops Breast:Examined in sitting and supine position were symmetrical in appearance, no palpable masses or tenderness,  no skin retraction, no nipple inversion, no nipple discharge, no skin discoloration, no axillary or supraclavicular lymphadenopathy Abdomen: no palpable masses or tenderness, no rebound or guarding Extremities: no edema or skin discoloration or tenderness  Pelvic: Vulva: Normal             Vagina: No gross lesions or discharge  Cervix:  No gross lesions or discharge  Uterus  AV, normal size, shape and consistency, non-tender and mobile  Adnexa  Without masses or tenderness  Anus: Normal   Assessment/Plan:  53 y.o. female for annual exam   1. Well female exam with routine gynecological exam Normal gynecologic exam.  Pap test May 2019 was negative, no indication to repeat this year.  Breast exam normal.  Screening mammogram August 2019 was negative.  Colonoscopy 2016.  Health labs with family physician.  Body mass index good at 24.63.  Continue with fitness and healthy nutrition.  2. Postmenopause Decision to stop hormone replacement therapy.  Will stop completely now given her surgery coming up in 2 weeks.  No postmenopausal bleeding.  Previous bone  density normal per patient.  Recommend to continue with vitamin D supplements, calcium intake of 1200 mg daily and regular weightbearing physical activities.  Other orders - gabapentin (NEURONTIN) 300 MG capsule; Take 300 mg by mouth 3 (three) times daily. - Calcium Carbonate-Vit D-Min (CALCIUM 1200 PO); Take by mouth. - cyanocobalamin 100 MCG tablet; Take 100 mcg by mouth daily.  Genia DelMarie-Lyne Usher Hedberg MD, 4:29 PM 10/06/2018

## 2018-10-08 ENCOUNTER — Encounter: Payer: Self-pay | Admitting: Obstetrics & Gynecology

## 2018-10-08 NOTE — Patient Instructions (Signed)
1. Well female exam with routine gynecological exam Normal gynecologic exam.  Pap test May 2019 was negative, no indication to repeat this year.  Breast exam normal.  Screening mammogram August 2019 was negative.  Colonoscopy 2016.  Health labs with family physician.  Body mass index good at 24.63.  Continue with fitness and healthy nutrition.  2. Postmenopause Decision to stop hormone replacement therapy.  Will stop completely now given her surgery coming up in 2 weeks.  No postmenopausal bleeding.  Previous bone density normal per patient.  Recommend to continue with vitamin D supplements, calcium intake of 1200 mg daily and regular weightbearing physical activities.  Other orders - gabapentin (NEURONTIN) 300 MG capsule; Take 300 mg by mouth 3 (three) times daily. - Calcium Carbonate-Vit D-Min (CALCIUM 1200 PO); Take by mouth. - cyanocobalamin 100 MCG tablet; Take 100 mcg by mouth daily.  Erica Franklin, it was a pleasure seeing you today!

## 2018-10-15 NOTE — Pre-Procedure Instructions (Signed)
Erica Franklin  10/15/2018      [facility] [place], [place] - [address] AT [address] [place] [address] Phone: [phone] Fax: [phone]    Your procedure is scheduled on October 21, 2018.  Report to [facility] Entrance "A" at 930 AM.  Call this number if you have problems the morning of surgery:  [phone]  Call [phone] if you have any questions prior to your surgery date Monday-Friday 8am-4pm    Remember:  Do not eat or drink after midnight.    Take these medicines the morning of surgery with A SIP OF WATER  Gabapentin (neurontin)  7 days prior to surgery STOP taking any Aspirin (unless otherwise instructed by your surgeon), Excedrin Migraine, Aleve, Naproxen, Ibuprofen, Motrin, Advil, Goody's, BC's, all herbal medications, fish oil, and all vitamins    Do not wear jewelry, make-up or nail polish.  Do not wear lotions, powders, or perfumes, or deodorant.  Do not shave 48 hours prior to surgery.    Do not bring valuables to the hospital.  [facility] is not responsible for any belongings or valuables.  Contacts, dentures or bridgework may not be worn into surgery.  Leave your suitcase in the car.  After surgery it may be brought to your room.  For patients admitted to the hospital, discharge time will be determined by your treatment team.  IF you are a smoker, DO NOT Smoke 24 hours prior to surgery   IF you wear a CPAP at night please bring your mask, tubing, and machine the morning of surgery    Remember that you must have someone to transport you home after your surgery, and remain with you for 24 hours if you are discharged the same day.  Patients discharged the day of surgery will not be allowed to drive home.    [facility]- Preparing For Surgery  Before surgery, you can play an important role. Because skin is not sterile, your skin needs to be as free of germs as possible. You can reduce the number of germs  on your skin by washing with CHG (chlorahexidine gluconate) Soap before surgery.  CHG is an antiseptic cleaner which kills germs and bonds with the skin to continue killing germs even after washing.    Oral Hygiene is also important to reduce your risk of infection.  Remember - BRUSH YOUR TEETH THE MORNING OF SURGERY WITH YOUR REGULAR TOOTHPASTE  Please do not use if you have an allergy to CHG or antibacterial soaps. If your skin becomes reddened/irritated stop using the CHG.  Do not shave (including legs and underarms) for at least 48 hours prior to first CHG shower. It is OK to shave your face.  Please follow these instructions carefully.   1. Shower the NIGHT BEFORE SURGERY and the MORNING OF SURGERY with CHG.   2. If you chose to wash your hair, wash your hair first as usual with your normal shampoo.  3. After you shampoo, rinse your hair and body thoroughly to remove the shampoo.  4. Use CHG as you would any other liquid soap. You can apply CHG directly to the skin and wash gently with a scrungie or a clean washcloth.   5. Apply the CHG Soap to your body ONLY FROM THE NECK DOWN.  Do not use on open wounds or open sores. Avoid contact with your eyes, ears, mouth and genitals (private parts). Wash Face and  genitals (private parts)  with your normal soap.  6. Wash thoroughly, paying special attention to the area where your surgery will be performed.  7. Thoroughly rinse your body with warm water from the neck down.  8. DO NOT shower/wash with your normal soap after using and rinsing off the CHG Soap.  9. Pat yourself dry with a CLEAN TOWEL.  10. Wear CLEAN PAJAMAS to bed the night before surgery, wear comfortable clothes the morning of surgery  11. Place CLEAN SHEETS on your bed the night of your first shower and DO NOT SLEEP WITH PETS.  Day of Surgery: Shower as above Do not apply any deodorants/lotions.  Please wear clean clothes to the hospital/surgery center.   Remember to  brush your teeth WITH YOUR REGULAR TOOTHPASTE.   Please read over the following fact sheets that you were given.

## 2018-10-18 ENCOUNTER — Other Ambulatory Visit: Payer: Self-pay

## 2018-10-18 ENCOUNTER — Encounter (HOSPITAL_COMMUNITY): Payer: Self-pay

## 2018-10-18 ENCOUNTER — Ambulatory Visit (HOSPITAL_COMMUNITY)
Admission: RE | Admit: 2018-10-18 | Discharge: 2018-10-18 | Disposition: A | Discharge status: Home / Self Care | Payer: Medicaid Other | Source: Ambulatory Visit | Attending: Orthopedic Surgery | Admitting: Orthopedic Surgery

## 2018-10-18 ENCOUNTER — Encounter (HOSPITAL_COMMUNITY)
Admission: RE | Admit: 2018-10-18 | Discharge: 2018-10-18 | Disposition: A | Discharge status: Home / Self Care | Payer: Medicaid Other | Source: Ambulatory Visit | Attending: Orthopedic Surgery | Admitting: Orthopedic Surgery

## 2018-10-18 ENCOUNTER — Other Ambulatory Visit (HOSPITAL_COMMUNITY)
Admission: RE | Admit: 2018-10-18 | Discharge: 2018-10-18 | Disposition: A | Discharge status: Home / Self Care | Payer: Medicaid Other | Source: Ambulatory Visit | Attending: Orthopedic Surgery | Admitting: Orthopedic Surgery

## 2018-10-18 DIAGNOSIS — Z01818 Encounter for other preprocedural examination: Secondary | ICD-10-CM | POA: Insufficient documentation

## 2018-10-18 LAB — URINALYSIS, ROUTINE W REFLEX MICROSCOPIC
Bilirubin Urine: NEGATIVE
Glucose, UA: NEGATIVE mg/dL
Hgb urine dipstick: NEGATIVE
Ketones, ur: NEGATIVE mg/dL
Leukocytes,Ua: NEGATIVE
Nitrite: NEGATIVE
Protein, ur: NEGATIVE mg/dL
Specific Gravity, Urine: 1.004 — ABNORMAL LOW (ref 1.005–1.030)
pH: 6 (ref 5.0–8.0)

## 2018-10-18 LAB — SURGICAL PCR SCREEN
MRSA, PCR: NEGATIVE
Staphylococcus aureus: NEGATIVE

## 2018-10-18 LAB — BASIC METABOLIC PANEL
Anion gap: 7 (ref 5–15)
BUN: 11 mg/dL (ref 6–20)
CO2: 26 mmol/L (ref 22–32)
Calcium: 9.2 mg/dL (ref 8.9–10.3)
Chloride: 107 mmol/L (ref 98–111)
Creatinine, Ser: 0.68 mg/dL (ref 0.44–1.00)
GFR calc Af Amer: >60 mL/min (ref >60)
GFR calc non Af Amer: >60 mL/min (ref >60)
Glucose, Bld: 91 mg/dL (ref 70–99)
Potassium: 3.9 mmol/L (ref 3.5–5.1)
Sodium: 140 mmol/L (ref 135–145)

## 2018-10-18 LAB — CBC
HCT: 39.9 % (ref 36.0–46.0)
Hemoglobin: 13 g/dL (ref 12.0–15.0)
MCH: 31.2 pg (ref 26.0–34.0)
MCHC: 32.6 g/dL (ref 30.0–36.0)
MCV: 95.7 fL (ref 80.0–100.0)
Platelets: 182 10*3/uL (ref 150–400)
RBC: 4.17 MIL/uL (ref 3.87–5.11)
RDW: 12.1 % (ref 11.5–15.5)
WBC: 4.5 10*3/uL (ref 4.0–10.5)
nRBC: 0 % (ref 0.0–0.2)

## 2018-10-18 LAB — PROTIME-INR
INR: 1 (ref 0.8–1.2)
Prothrombin Time: 13.1 seconds (ref 11.4–15.2)

## 2018-10-18 NOTE — Progress Notes (Signed)
  Coronavirus Screening COVID test scheduled today at [facility] Have you experienced the following symptoms:  Cough yes/no: No Fever (>100.18F)  yes/no: No Runny nose yes/no: No Sore throat yes/no: No Difficulty breathing/shortness of breath  yes/no: No Have you or a family member traveled in the last 14 days and where? yes/no: No  PCP - Dr. Delfina Redwood.( Has received pre-op clearance from PCP per notes by Nolberto Hanlon)  Cardiologist - denies  OBGyn-Dr. Dellis Filbert  Chest x-ray - today  EKG - NA  Stress Test - 2003,out of state. Was normal.  ECHO - denies  Cardiac Cath - denies  AICD-denies PM-denies LOOP-denies  Sleep Study - denies CPAP - NA  LABS-CXR, CBC,BMP,PT-INR,UA,PCR  ASA-denies  ERAS-NA  HA1C-denies Fasting Blood Sugar -  Checks Blood Sugar _0____ times a day  Anesthesia-Y. Per MD order Pt gave h/o vasovagal attack(dizziness,light headedness) when pt received 2 doses of Prednisone inj. one in each shoulder during office visit. Per pt., sx lasted for 52min. Did not lose consciousness. Pt does not give any cardiac history of chest pain,SOB, palpitations ,orthopnea or dyspnea.  Pt denies having chest pain, sob, or fever at this time. All instructions explained to the pt, with a verbal understanding of the material. Pt agrees to go over the instructions while at home for a better understanding. Pt also instructed to self quarantine after being tested for COVID-19. The opportunity to ask questions was provided.

## 2018-10-18 NOTE — Anesthesia Preprocedure Evaluation (Addendum)
Anesthesia Evaluation  Patient identified by MRN, date of birth, ID band Patient awake    Reviewed: Allergy & Precautions, NPO status , Patient's Chart, lab work & pertinent test results  Airway Mallampati: II  TM Distance: >3 FB Neck ROM: Full    Dental no notable dental hx. (+) Teeth Intact, Dental Advisory Given,    Pulmonary neg pulmonary ROS, former smoker,    Pulmonary exam normal breath sounds clear to auscultation       Cardiovascular negative cardio ROS Normal cardiovascular exam Rhythm:Regular Rate:Normal     Neuro/Psych negative neurological ROS  negative psych ROS   GI/Hepatic negative GI ROS, Neg liver ROS,   Endo/Other  negative endocrine ROS  Renal/GU negative Renal ROS  negative genitourinary   Musculoskeletal negative musculoskeletal ROS (+)   Abdominal   Peds  Hematology negative hematology ROS (+)   Anesthesia Other Findings   Reproductive/Obstetrics                            Anesthesia Physical Anesthesia Plan  ASA: II  Anesthesia Plan: General   Post-op Pain Management:    Induction: Intravenous  PONV Risk Score and Plan: 3 and Ondansetron, Dexamethasone and Midazolam  Airway Management Planned: Oral ETT  Additional Equipment:   Intra-op Plan:   Post-operative Plan: Extubation in OR  Informed Consent: I have reviewed the patients History and Physical, chart, labs and discussed the procedure including the risks, benefits and alternatives for the proposed anesthesia with the patient or authorized representative who has indicated his/her understanding and acceptance.     Dental advisory given  Plan Discussed with: CRNA  Anesthesia Plan Comments: (PAT note written 10/18/2018 by Myra Gianotti, PA-C. )       Anesthesia Quick Evaluation

## 2018-10-18 NOTE — Progress Notes (Signed)
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  History includes former smoker (quit 2008), C5-7 ACDF, L4-5 PLIF (Dr. Rennis Harding, ~ 2015), cosmetic surgery, endometrial ablation. She reported history of "vasovagal" response (dizzyness, lightheadedness) after shoulder steroid injection, but denied syncope, chest pain, palpitations, SOB, orthopnea symptoms.  She had a benign appearing right thyroid cysts on 09/2018 [place].  10/18/2018 presurgical cover test is still in process.  If negative and otherwise no acute changes in anticipate that she can proceed as planned.   VS: BP 134/76   Pulse 80   Temp 36.5 C (Temporal)   Resp 18   Ht 5\' 5"  (1.651 m)   Wt 68.6 kg   SpO2 100%   BMI 25.16 kg/m    PROVIDERS: Seward Carol, MD is PCP Princess Bruins, MD is GYN. She is having patient hold HRT 2 weeks prior to surgery--knowing patient does not plan to resume afterwards.   LABS: Labs reviewed: Acceptable for surgery. (all labs ordered are listed, but only abnormal results are displayed)  Labs Reviewed  URINALYSIS, ROUTINE W REFLEX MICROSCOPIC - Abnormal; Notable for the following components:      Result Value   Color, Urine STRAW (*)    Specific Gravity, Urine 1.004 (*)    All other components within normal limits  SURGICAL PCR SCREEN  BASIC METABOLIC PANEL  CBC  PROTIME-INR    IMAGES: CXR 10/18/18: FINDINGS: Lungs are clear. Heart size and pulmonary vascularity are normal. No adenopathy. There is postoperative change in the lower cervical region. IMPRESSION: No edema or consolidation.  Stable cardiac silhouette.  Thyroid US  09/24/18: IMPRESSION: Benign right thyroid cystic nodules measuring 1.1 cm or less in size. No other significant thyroid abnormality.  CT C-spine 02/19/18: FINDINGS: Solid interbody and anterior plate and screw fusion at the C5 through C7 levels with normal alignment. Mild to moderate anterior and minimal posterior spur formation at the C4-5 level and mild anterior spur formation at the C3-4 level. There are no oblique views to assess the neural foramina. IMPRESSION: Postoperative and degenerative changes, as described above [See full report].   EKG: N/A   CV: N/A   Past Medical History:  Diagnosis Date  . Back pain   . Blood transfusion without reported diagnosis   . Cyst of thyroid 08/2018    Past Surgical History:  Procedure Laterality Date  . CERVICAL SPINE SURGERY    . CESAREAN SECTION     x2  . COSMETIC SURGERY    . DILATION AND CURETTAGE OF UTERUS     2  . LUMBAR SPINE SURGERY    . SPINE SURGERY    . TONSILLECTOMY Bilateral age 22  . TUBAL LIGATION      MEDICATIONS: . Ascorbic Acid (VITAMIN C) 1000 MG tablet  . aspirin-acetaminophen-caffeine (EXCEDRIN MIGRAINE) 250-250-65 MG tablet  . Calcium Carb-Cholecalciferol (CALCIUM 600+D3 PO)  . cholecalciferol (VITAMIN D) 1000 units tablet  . gabapentin (NEURONTIN) 300 MG capsule  . Lidocaine 4 % PTCH  . progesterone (PROMETRIUM) 100 MG capsule  . tiZANidine (ZANAFLEX) 4 MG tablet  . vitamin B-12 (CYANOCOBALAMIN) 1000 MCG tablet   No  current facility-administered medications for this encounter.     [name], PA-C Surgical Short Stay/Anesthesiology [facility] Phone [phone] [facility] Phone [phone] 10/18/2018 3:24 PM

## 2018-10-19 LAB — SARS CORONAVIRUS 2 (TAT 6-24 HRS): SARS Coronavirus 2: NEGATIVE

## 2018-10-21 ENCOUNTER — Ambulatory Visit (HOSPITAL_COMMUNITY): Payer: Medicaid Other

## 2018-10-21 ENCOUNTER — Encounter (HOSPITAL_COMMUNITY)
Admission: RE | Disposition: A | Discharge status: Home / Self Care | Payer: Self-pay | Source: Home / Self Care | Attending: Orthopedic Surgery

## 2018-10-21 ENCOUNTER — Ambulatory Visit (HOSPITAL_COMMUNITY)
Admission: RE | Admit: 2018-10-21 | Discharge: 2018-10-21 | Disposition: A | Discharge status: Home / Self Care | Payer: Medicaid Other | Attending: Orthopedic Surgery | Admitting: Orthopedic Surgery

## 2018-10-21 ENCOUNTER — Ambulatory Visit (HOSPITAL_COMMUNITY): Payer: Medicaid Other | Admitting: Certified Registered Nurse Anesthetist

## 2018-10-21 ENCOUNTER — Ambulatory Visit (HOSPITAL_COMMUNITY): Payer: Medicaid Other | Admitting: Vascular Surgery

## 2018-10-21 ENCOUNTER — Other Ambulatory Visit: Payer: Self-pay

## 2018-10-21 ENCOUNTER — Encounter (HOSPITAL_COMMUNITY): Payer: Self-pay

## 2018-10-21 DIAGNOSIS — E041 Nontoxic single thyroid nodule: Secondary | ICD-10-CM | POA: Insufficient documentation

## 2018-10-21 DIAGNOSIS — M79602 Pain in left arm: Secondary | ICD-10-CM | POA: Insufficient documentation

## 2018-10-21 DIAGNOSIS — Z419 Encounter for procedure for purposes other than remedying health state, unspecified: Secondary | ICD-10-CM

## 2018-10-21 DIAGNOSIS — M4802 Spinal stenosis, cervical region: Secondary | ICD-10-CM | POA: Insufficient documentation

## 2018-10-21 DIAGNOSIS — Z87891 Personal history of nicotine dependence: Secondary | ICD-10-CM | POA: Insufficient documentation

## 2018-10-21 DIAGNOSIS — Z808 Family history of malignant neoplasm of other organs or systems: Secondary | ICD-10-CM | POA: Insufficient documentation

## 2018-10-21 DIAGNOSIS — M50121 Cervical disc disorder at C4-C5 level with radiculopathy: Secondary | ICD-10-CM | POA: Insufficient documentation

## 2018-10-21 DIAGNOSIS — Z791 Long term (current) use of non-steroidal anti-inflammatories (NSAID): Secondary | ICD-10-CM | POA: Diagnosis not present

## 2018-10-21 DIAGNOSIS — Z8249 Family history of ischemic heart disease and other diseases of the circulatory system: Secondary | ICD-10-CM | POA: Diagnosis not present

## 2018-10-21 DIAGNOSIS — M2578 Osteophyte, vertebrae: Secondary | ICD-10-CM | POA: Insufficient documentation

## 2018-10-21 DIAGNOSIS — Z803 Family history of malignant neoplasm of breast: Secondary | ICD-10-CM | POA: Insufficient documentation

## 2018-10-21 DIAGNOSIS — Z981 Arthrodesis status: Secondary | ICD-10-CM | POA: Diagnosis not present

## 2018-10-21 DIAGNOSIS — Z79899 Other long term (current) drug therapy: Secondary | ICD-10-CM | POA: Diagnosis not present

## 2018-10-21 DIAGNOSIS — Z885 Allergy status to narcotic agent status: Secondary | ICD-10-CM | POA: Insufficient documentation

## 2018-10-21 DIAGNOSIS — M5020 Other cervical disc displacement, unspecified cervical region: Secondary | ICD-10-CM | POA: Diagnosis present

## 2018-10-21 DIAGNOSIS — M50321 Other cervical disc degeneration at C4-C5 level: Secondary | ICD-10-CM | POA: Diagnosis present

## 2018-10-21 HISTORY — PX: ANTERIOR CERVICAL DECOMP/DISCECTOMY FUSION: SHX1161

## 2018-10-21 SURGERY — ANTERIOR CERVICAL DECOMPRESSION/DISCECTOMY FUSION 1 LEVEL
Anesthesia: General

## 2018-10-21 MED ORDER — HEMOSTATIC AGENTS (NO CHARGE) OPTIME
TOPICAL | Status: DC | PRN
  Administered 2018-10-21: 1 via TOPICAL

## 2018-10-21 MED ORDER — MIDAZOLAM HCL 2 MG/2ML IJ SOLN
INTRAMUSCULAR | Status: AC
  Filled 2018-10-21: qty 2

## 2018-10-21 MED ORDER — SUGAMMADEX SODIUM 200 MG/2ML IV SOLN
INTRAVENOUS | Status: DC | PRN
  Administered 2018-10-21: 130 mg via INTRAVENOUS

## 2018-10-21 MED ORDER — 0.9 % SODIUM CHLORIDE (POUR BTL) OPTIME
TOPICAL | Status: DC | PRN
  Administered 2018-10-21 (×2): 1000 mL

## 2018-10-21 MED ORDER — PHENYLEPHRINE 40 MCG/ML (10ML) SYRINGE FOR IV PUSH (FOR BLOOD PRESSURE SUPPORT)
PREFILLED_SYRINGE | INTRAVENOUS | Status: DC | PRN
  Administered 2018-10-21: 80 ug via INTRAVENOUS

## 2018-10-21 MED ORDER — OXYCODONE HCL 5 MG PO TABS
10.0000 mg | ORAL_TABLET | ORAL | Status: DC | PRN
  Administered 2018-10-21: 10 mg via ORAL

## 2018-10-21 MED ORDER — ONDANSETRON HCL 4 MG/2ML IJ SOLN
4.0000 mg | Freq: Four times a day (QID) | INTRAMUSCULAR | Status: DC | PRN
  Administered 2018-10-21: 18:00:00 4 mg via INTRAVENOUS
  Filled 2018-10-21: qty 2

## 2018-10-21 MED ORDER — FENTANYL CITRATE (PF) 100 MCG/2ML IJ SOLN
25.0000 ug | INTRAMUSCULAR | Status: DC | PRN
  Administered 2018-10-21 (×2): 25 ug via INTRAVENOUS

## 2018-10-21 MED ORDER — DEXAMETHASONE SODIUM PHOSPHATE 10 MG/ML IJ SOLN
INTRAMUSCULAR | Status: AC
  Filled 2018-10-21: qty 1

## 2018-10-21 MED ORDER — SODIUM CHLORIDE 0.9 % IV SOLN
INTRAVENOUS | Status: DC | PRN
  Administered 2018-10-21: 11:00:00 15 ug/min via INTRAVENOUS

## 2018-10-21 MED ORDER — SODIUM CHLORIDE 0.9 % IV SOLN: 250.0000 mL | INTRAVENOUS | Status: DC

## 2018-10-21 MED ORDER — METHOCARBAMOL 500 MG PO TABS
ORAL_TABLET | ORAL | Status: AC
  Filled 2018-10-21: qty 1

## 2018-10-21 MED ORDER — FENTANYL CITRATE (PF) 250 MCG/5ML IJ SOLN
INTRAMUSCULAR | Status: AC
  Filled 2018-10-21: qty 5

## 2018-10-21 MED ORDER — ONDANSETRON HCL 4 MG PO TABS: 4.0000 mg | ORAL_TABLET | Freq: Three times a day (TID) | ORAL | 0 refills | Status: DC | PRN

## 2018-10-21 MED ORDER — PHENOL 1.4 % MT LIQD
1.0000 | OROMUCOSAL | Status: DC | PRN
  Filled 2018-10-21: qty 177

## 2018-10-21 MED ORDER — PROPOFOL 10 MG/ML IV BOLUS
INTRAVENOUS | Status: DC | PRN
  Administered 2018-10-21: 150 mg via INTRAVENOUS

## 2018-10-21 MED ORDER — LACTATED RINGERS IV SOLN
INTRAVENOUS | Status: DC
  Administered 2018-10-21: 10:00:00 via INTRAVENOUS

## 2018-10-21 MED ORDER — OXYCODONE-ACETAMINOPHEN 10-325 MG PO TABS: 1.0000 | ORAL_TABLET | Freq: Four times a day (QID) | ORAL | 0 refills | Status: AC | PRN

## 2018-10-21 MED ORDER — STERILE WATER FOR IRRIGATION IR SOLN
Status: DC | PRN
  Administered 2018-10-21: 1000 mL

## 2018-10-21 MED ORDER — CEFAZOLIN SODIUM-DEXTROSE 2-4 GM/100ML-% IV SOLN
INTRAVENOUS | Status: AC
  Filled 2018-10-21: qty 100

## 2018-10-21 MED ORDER — METHOCARBAMOL 1000 MG/10ML IJ SOLN
500.0000 mg | Freq: Four times a day (QID) | INTRAVENOUS | Status: DC | PRN
  Filled 2018-10-21: qty 5

## 2018-10-21 MED ORDER — SODIUM CHLORIDE 0.9% FLUSH
3.0000 mL | Freq: Two times a day (BID) | INTRAVENOUS | Status: DC
  Administered 2018-10-21: 18:00:00 3 mL via INTRAVENOUS

## 2018-10-21 MED ORDER — LACTATED RINGERS IV SOLN: INTRAVENOUS | Status: DC

## 2018-10-21 MED ORDER — BUPIVACAINE-EPINEPHRINE (PF) 0.25% -1:200000 IJ SOLN
INTRAMUSCULAR | Status: AC
  Filled 2018-10-21: qty 30

## 2018-10-21 MED ORDER — BUPIVACAINE-EPINEPHRINE 0.25% -1:200000 IJ SOLN
INTRAMUSCULAR | Status: DC | PRN
  Administered 2018-10-21: 4.5 mL

## 2018-10-21 MED ORDER — MENTHOL 3 MG MT LOZG: 1.0000 | LOZENGE | OROMUCOSAL | Status: DC | PRN

## 2018-10-21 MED ORDER — ROCURONIUM BROMIDE 10 MG/ML (PF) SYRINGE
PREFILLED_SYRINGE | INTRAVENOUS | Status: DC | PRN
  Administered 2018-10-21: 60 mg via INTRAVENOUS
  Administered 2018-10-21: 20 mg via INTRAVENOUS

## 2018-10-21 MED ORDER — METHOCARBAMOL 500 MG PO TABS
500.0000 mg | ORAL_TABLET | Freq: Four times a day (QID) | ORAL | Status: DC | PRN
  Administered 2018-10-21: 14:00:00 500 mg via ORAL

## 2018-10-21 MED ORDER — FENTANYL CITRATE (PF) 100 MCG/2ML IJ SOLN
INTRAMUSCULAR | Status: AC
  Filled 2018-10-21: qty 2

## 2018-10-21 MED ORDER — METHOCARBAMOL 500 MG PO TABS: 500.0000 mg | ORAL_TABLET | Freq: Three times a day (TID) | ORAL | 0 refills | Status: AC | PRN

## 2018-10-21 MED ORDER — DIPHENHYDRAMINE HCL 25 MG PO CAPS
25.0000 mg | ORAL_CAPSULE | ORAL | Status: DC | PRN
  Administered 2018-10-21: 25 mg via ORAL
  Filled 2018-10-21: qty 1

## 2018-10-21 MED ORDER — MIDAZOLAM HCL 2 MG/2ML IJ SOLN
INTRAMUSCULAR | Status: DC | PRN
  Administered 2018-10-21: 1 mg via INTRAVENOUS
  Administered 2018-10-21: 2 mg via INTRAVENOUS

## 2018-10-21 MED ORDER — ONDANSETRON HCL 4 MG PO TABS: 4.0000 mg | ORAL_TABLET | Freq: Four times a day (QID) | ORAL | Status: DC | PRN

## 2018-10-21 MED ORDER — CEFAZOLIN SODIUM-DEXTROSE 1-4 GM/50ML-% IV SOLN: 1.0000 g | Freq: Three times a day (TID) | INTRAVENOUS | Status: DC

## 2018-10-21 MED ORDER — ONDANSETRON HCL 4 MG/2ML IJ SOLN
INTRAMUSCULAR | Status: DC | PRN
  Administered 2018-10-21: 4 mg via INTRAVENOUS

## 2018-10-21 MED ORDER — CEFAZOLIN SODIUM-DEXTROSE 2-4 GM/100ML-% IV SOLN
2.0000 g | INTRAVENOUS | Status: AC
  Administered 2018-10-21: 2 g via INTRAVENOUS

## 2018-10-21 MED ORDER — ACETAMINOPHEN 650 MG RE SUPP: 650.0000 mg | RECTAL | Status: DC | PRN

## 2018-10-21 MED ORDER — ONDANSETRON HCL 4 MG/2ML IJ SOLN
INTRAMUSCULAR | Status: AC
  Filled 2018-10-21: qty 2

## 2018-10-21 MED ORDER — ACETAMINOPHEN 325 MG PO TABS: 650.0000 mg | ORAL_TABLET | ORAL | Status: DC | PRN

## 2018-10-21 MED ORDER — MORPHINE SULFATE (PF) 2 MG/ML IV SOLN
2.0000 mg | INTRAVENOUS | Status: DC | PRN
  Administered 2018-10-21 (×2): 2 mg via INTRAVENOUS
  Filled 2018-10-21 (×2): qty 1

## 2018-10-21 MED ORDER — LIDOCAINE 2% (20 MG/ML) 5 ML SYRINGE
INTRAMUSCULAR | Status: DC | PRN
  Administered 2018-10-21: 60 mg via INTRAVENOUS

## 2018-10-21 MED ORDER — DEXAMETHASONE SODIUM PHOSPHATE 10 MG/ML IJ SOLN
INTRAMUSCULAR | Status: DC | PRN
  Administered 2018-10-21: 10 mg via INTRAVENOUS

## 2018-10-21 MED ORDER — SODIUM CHLORIDE 0.9% FLUSH: 3.0000 mL | INTRAVENOUS | Status: DC | PRN

## 2018-10-21 MED ORDER — FENTANYL CITRATE (PF) 250 MCG/5ML IJ SOLN
INTRAMUSCULAR | Status: DC | PRN
  Administered 2018-10-21: 100 ug via INTRAVENOUS
  Administered 2018-10-21 (×3): 50 ug via INTRAVENOUS

## 2018-10-21 MED ORDER — OXYCODONE HCL 5 MG PO TABS
ORAL_TABLET | ORAL | Status: AC
  Filled 2018-10-21: qty 2

## 2018-10-21 SURGICAL SUPPLY — 63 items
BLADE CLIPPER SURG (BLADE) IMPLANT
BONE VIVIGEN FORMABLE 1.3CC (Bone Implant) ×3 IMPLANT
CABLE BIPOLOR RESECTION CORD (MISCELLANEOUS) ×3 IMPLANT
CAGE SPNL 6D 14XMED 16X7X (Cage) ×1 IMPLANT
CANISTER SUCT 3000ML PPV (MISCELLANEOUS) ×3 IMPLANT
CLOSURE STERI-STRIP 1/2X4 (GAUZE/BANDAGES/DRESSINGS) ×1
CLSR STERI-STRIP ANTIMIC 1/2X4 (GAUZE/BANDAGES/DRESSINGS) ×2 IMPLANT
COVER MAYO STAND STRL (DRAPES) ×9 IMPLANT
COVER PLATE (Plate) ×2 IMPLANT
COVER SURGICAL LIGHT HANDLE (MISCELLANEOUS) ×3 IMPLANT
COVER WAND RF STERILE (DRAPES) IMPLANT
DRAPE C-ARM 42X72 X-RAY (DRAPES) ×3 IMPLANT
DRAPE POUCH INSTRU U-SHP 10X18 (DRAPES) ×3 IMPLANT
DRAPE SURG 17X23 STRL (DRAPES) ×3 IMPLANT
DRAPE U-SHAPE 47X51 STRL (DRAPES) ×3 IMPLANT
DRSG OPSITE POSTOP 3X4 (GAUZE/BANDAGES/DRESSINGS) IMPLANT
DRSG OPSITE POSTOP 4X6 (GAUZE/BANDAGES/DRESSINGS) ×3 IMPLANT
DURAPREP 26ML APPLICATOR (WOUND CARE) ×3 IMPLANT
ELECT COATED BLADE 2.86 ST (ELECTRODE) ×3 IMPLANT
ELECT PENCIL ROCKER SW 15FT (MISCELLANEOUS) ×3 IMPLANT
ELECT REM PT RETURN 9FT ADLT (ELECTROSURGICAL) ×3
ELECTRODE REM PT RTRN 9FT ADLT (ELECTROSURGICAL) ×1 IMPLANT
FUSION TCS NANOLOCK 7MM 6DEG (Cage) ×2 IMPLANT
GLOVE BIO SURGEON STRL SZ 6.5 (GLOVE) IMPLANT
GLOVE BIO SURGEONS STRL SZ 6.5 (GLOVE)
GLOVE BIOGEL PI IND STRL 6.5 (GLOVE) IMPLANT
GLOVE BIOGEL PI IND STRL 8.5 (GLOVE) ×1 IMPLANT
GLOVE BIOGEL PI INDICATOR 6.5 (GLOVE)
GLOVE BIOGEL PI INDICATOR 8.5 (GLOVE) ×2
GLOVE SS BIOGEL STRL SZ 8.5 (GLOVE) IMPLANT
GLOVE SUPERSENSE BIOGEL SZ 8.5 (GLOVE)
GOWN STRL REUS W/ TWL LRG LVL3 (GOWN DISPOSABLE) ×2 IMPLANT
GOWN STRL REUS W/TWL 2XL LVL3 (GOWN DISPOSABLE) ×3 IMPLANT
GOWN STRL REUS W/TWL LRG LVL3 (GOWN DISPOSABLE) ×4
KIT BASIN OR (CUSTOM PROCEDURE TRAY) ×3 IMPLANT
KIT TURNOVER KIT B (KITS) ×3 IMPLANT
NEEDLE HYPO 22GX1.5 SAFETY (NEEDLE) ×3 IMPLANT
NEEDLE SPNL 18GX3.5 QUINCKE PK (NEEDLE) ×3 IMPLANT
NS IRRIG 1000ML POUR BTL (IV SOLUTION) ×6 IMPLANT
PACK ORTHO CERVICAL (CUSTOM PROCEDURE TRAY) ×3 IMPLANT
PACK UNIVERSAL I (CUSTOM PROCEDURE TRAY) ×3 IMPLANT
PAD ARMBOARD 7.5X6 YLW CONV (MISCELLANEOUS) ×6 IMPLANT
PATTIES SURGICAL .25X.25 (GAUZE/BANDAGES/DRESSINGS) IMPLANT
PIN DISTRACTION 14 (PIN) ×6 IMPLANT
PLATE LOCK ENDO TCS (Plate) ×1 IMPLANT
POSITIONER HEAD DONUT 9IN (MISCELLANEOUS) ×3 IMPLANT
RESTRAINT LIMB HOLDER UNIV (RESTRAINTS) ×3 IMPLANT
SCREW ENDO BONE 3.8X14MM (Screw) ×3 IMPLANT
SCREW LOCKING 14MMX3.5MM (Screw) ×3 IMPLANT
SPONGE INTESTINAL PEANUT (DISPOSABLE) ×3 IMPLANT
SPONGE SURGIFOAM ABS GEL SZ50 (HEMOSTASIS) ×3 IMPLANT
SURGIFLO W/THROMBIN 8M KIT (HEMOSTASIS) ×3 IMPLANT
SUT BONE WAX W31G (SUTURE) ×3 IMPLANT
SUT MON AB 3-0 SH 27 (SUTURE) ×2
SUT MON AB 3-0 SH27 (SUTURE) ×1 IMPLANT
SUT VIC AB 2-0 CT1 18 (SUTURE) ×3 IMPLANT
SYR BULB IRRIGATION 50ML (SYRINGE) ×3 IMPLANT
SYR CONTROL 10ML LL (SYRINGE) ×3 IMPLANT
TAPE CLOTH 4X10 WHT NS (GAUZE/BANDAGES/DRESSINGS) ×3 IMPLANT
TAPE UMBILICAL COTTON 1/8X30 (MISCELLANEOUS) ×3 IMPLANT
TOWEL GREEN STERILE (TOWEL DISPOSABLE) ×3 IMPLANT
TOWEL GREEN STERILE FF (TOWEL DISPOSABLE) ×3 IMPLANT
WATER STERILE IRR 1000ML POUR (IV SOLUTION) ×3 IMPLANT

## 2018-10-21 NOTE — Transfer of Care (Signed)
Immediate Anesthesia Transfer of Care Note  Patient: Erica Franklin  Procedure(s) Performed: ANTERIOR CERVICAL DECOMPRESSION/DISCECTOMY FUSION C4-5 (N/A )  Patient Location: PACU  Anesthesia Type:General  Level of Consciousness: drowsy and patient cooperative  Airway & Oxygen Therapy: Patient Spontanous Breathing  Post-op Assessment: Report given to RN, Post -op Vital signs reviewed and stable and Patient moving all extremities X 4  Post vital signs: Reviewed and stable  Last Vitals:  Vitals Value Taken Time  BP 128/73 10/21/18 1343  Temp 36.9 C 10/21/18 1345  Pulse 85 10/21/18 1346  Resp 12 10/21/18 1346  SpO2 98 % 10/21/18 1346  Vitals shown include unvalidated device data.  Last Pain:  Vitals:   10/21/18 0953  TempSrc:   PainSc: 7       Patients Stated Pain Goal: 2 (16/10/96 0454)  Complications: No apparent anesthesia complications

## 2018-10-21 NOTE — Progress Notes (Signed)
Pt appears to be getting anxious, saying "can't breathe, chest feels tight, need to cough it out". Requesting medication to calm her down. SaO2-100%, HR-89, Normotensive. Notified Dr. Lanetta Inch. Will continue to monitor.

## 2018-10-21 NOTE — Anesthesia Procedure Notes (Signed)
Procedure Name: Intubation Date/Time: 10/21/2018 11:20 AM Performed by: Julieta Bellini, CRNA Pre-anesthesia Checklist: Patient identified, Emergency Drugs available, Suction available and Patient being monitored Patient Re-evaluated:Patient Re-evaluated prior to induction Oxygen Delivery Method: Circle system utilized Preoxygenation: Pre-oxygenation with 100% oxygen Induction Type: IV induction Ventilation: Mask ventilation without difficulty Laryngoscope Size: Glidescope and 3 Grade View: Grade I Tube type: Oral Tube size: 7.0 mm Number of attempts: 1 Airway Equipment and Method: Oral airway,  Video-laryngoscopy and Rigid stylet Placement Confirmation: ETT inserted through vocal cords under direct vision,  positive ETCO2 and breath sounds checked- equal and bilateral Secured at: 21 cm Tube secured with: Tape Dental Injury: Teeth and Oropharynx as per pre-operative assessment

## 2018-10-21 NOTE — Brief Op Note (Signed)
10/21/2018  1:49 PM  PATIENT:  Daneisha Surges  53 y.o. 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

## 2018-10-21 NOTE — Progress Notes (Signed)
Patient very anxious to go home, discharged instructions/education/AVS/Rx given to patient and verbalized understanding. Pain is mild to moderate and controlled by RRN pain medications. Swallowing well with some soreness on her throat, tolerated dinner with no problem. No swelling, no drainage noted on incision site. Ambulating well and moving all extremities well. Voiding and emptying bladder well. Discharged via wheelchair.

## 2018-10-21 NOTE — Evaluation (Signed)
Physical Therapy Evaluation and Discharge Patient Details Name: Erica Franklin MRN: [id number] DOB: November 24, 1965 Today's Date: 10/21/2018   History of Present Illness  Pt is a 53 y/o female s/p C4-5 ACDF. PMH includes ACDF and lumbar surgery.   Clinical Impression  Patient evaluated by Physical Therapy with no further acute PT needs identified. All education has been completed and the patient has no further questions. Pt guarded during gait, however, overall steady. Gross supervision required for safety. Verbally reviewed safe stair navigation, maintaining precautions during ADLs, cervical precautions, and walking program to perform at home. Pt reports husband and daughter can assist at d/c. See below for any follow-up Physical Therapy or equipment needs. PT is signing off. Thank you for this referral. If needs change, please re-consult.       Follow Up Recommendations No PT follow up;Supervision for mobility/OOB    Equipment Recommendations  None recommended by PT    Recommendations for Other Services       Precautions / Restrictions Precautions Precautions: Cervical Precaution Booklet Issued: Yes (comment) Precaution Comments: Reviewed cervical precautions with pt.  Required Braces or Orthoses: Cervical Brace Cervical Brace: Hard collar Restrictions Weight Bearing Restrictions: No      Mobility  Bed Mobility Overal bed mobility: Modified Independent             General bed mobility comments: Educated about use of log roll technique at home, however, pt reports she has been sleeping in recliner.   Transfers Overall transfer level: Needs assistance Equipment used: None Transfers: Sit to/from Stand Sit to Stand: Supervision         General transfer comment: Supervision for safety. Guarded movement secondary to pain.   Ambulation/Gait Ambulation/Gait assistance: Supervision Gait Distance (Feet): 125 Feet Assistive device: None Gait Pattern/deviations: Step-through  pattern;Decreased stride length Gait velocity: Decreased    General Gait Details: Very guarded gait, however, overall steady. Cues to relax shoulders. Educated about generalized walking program to perform at home.   Stairs Stairs: Yes       General stair comments: Pt refusing to practice steps, however, verbally reviewed safe stair management. Educated about using step to pattern and sideways technique if needed to increase safety.   Wheelchair Mobility    Modified Rankin (Stroke Patients Only)       Balance Overall balance assessment: Needs assistance Sitting-balance support: No upper extremity supported;Feet supported Sitting balance-Leahy Scale: Good     Standing balance support: No upper extremity supported;During functional activity Standing balance-Leahy Scale: Fair                               Pertinent Vitals/Pain Pain Assessment: 0-10 Pain Score: 10-Worst pain ever Pain Location: neck Pain Descriptors / Indicators: Aching;Operative site guarding Pain Intervention(s): Limited activity within patient's tolerance;Monitored during session;Repositioned    Home Living Family/patient expects to be discharged to:: Private residence Living Arrangements: Spouse/significant other;Children Available Help at Discharge: Family;Available 24 hours/day Type of Home: House Home Access: Stairs to enter Entrance Stairs-Rails: Right Entrance Stairs-Number of Steps: 2 Home Layout: One level Home Equipment: None      Prior Function Level of Independence: Independent               Hand Dominance        Extremity/Trunk Assessment   Upper Extremity Assessment Upper Extremity Assessment: RUE deficits/detail RUE Deficits / Details: Reports pain in R shoulder at baseline     Lower Extremity Assessment  Lower Extremity Assessment: Overall WFL for tasks assessed    Cervical / Trunk Assessment Cervical / Trunk Assessment: Other exceptions Cervical /  Trunk Exceptions: s/p ACDF   Communication   Communication: No difficulties  Cognition Arousal/Alertness: Awake/alert Behavior During Therapy: WFL for tasks assessed/performed Overall Cognitive Status: Within Functional Limits for tasks assessed                                        General Comments General comments (skin integrity, edema, etc.): Reviewed how to maintain precautions when performing ADL tasks.     Exercises     Assessment/Plan    PT Assessment Patent does not need any further PT services  PT Problem List         PT Treatment Interventions      PT Goals (Current goals can be found in the Care Plan section)  Acute Rehab PT Goals Patient Stated Goal: to go home today PT Goal Formulation: With patient Time For Goal Achievement: 10/21/18 Potential to Achieve Goals: Good    Frequency     Barriers to discharge        Co-evaluation               AM-PAC PT "6 Clicks" Mobility  Outcome Measure Help needed turning from your back to your side while in a flat bed without using bedrails?: None Help needed moving from lying on your back to sitting on the side of a flat bed without using bedrails?: None Help needed moving to and from a bed to a chair (including a wheelchair)?: None Help needed standing up from a chair using your arms (e.g., wheelchair or bedside chair)?: None Help needed to walk in hospital room?: None Help needed climbing 3-5 steps with a railing? : A Little 6 Click Score: 23    End of Session Equipment Utilized During Treatment: Gait belt;Cervical collar Activity Tolerance: Patient tolerated treatment well Patient left: in bed;with call bell/phone within reach Nurse Communication: Mobility status;Patient requests pain meds PT Visit Diagnosis: Other abnormalities of gait and mobility (R26.89);Pain Pain - part of body: (neck )    Time: 6761-9509 PT Time Calculation (min) (ACUTE ONLY): 19 min   Charges:   PT  Evaluation $PT Eval Low Complexity: Shipman, PT, DPT  Acute Rehabilitation Services  Pager: [phone] Office: [phone]   Rudean Hitt 10/21/2018, 5:35 PM

## 2018-10-21 NOTE — H&P (Signed)
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  I have gone over the procedure as well as the risks and benefits with the patient and all of her questions were encouraged and addressed.  She is expressed an understanding and a desire to proceed with surgery.  Patient's clinical exam is essentially unchanged from her last office visit of 10/05/2018.

## 2018-10-21 NOTE — Discharge Instructions (Signed)
[facility] ° °Today you will be discharged from the hospital.  The purpose of the following handout is to help guide you over the next 2 weeks.  First and foremost, be sure you have a follow up appointment with Dr. Miaa Latterell 2 weeks from the time of your surgery to have your sutures removed.  Please call [facility] [phone] to schedule or confirm this appointment.   ° ° ° °Brace °You do not have to wear the collar while lying in bed or sitting in a high-backed chair, eating, sleeping or showering.  Other than these instances, you must wear the brace.  You may NOT wear the collar while driving a vehicle (see driving restrictions below).  It is advisable that you wear the collar in public places or while traveling in a car as a passenger.  Dr. Tashika Goodin will discuss further use of the collar at your 2 week postop visit. ° °Wound Care °You may SHOWER 5 days from the date of surgery.  Shower directly over the steri-strips.  DO NOT scrub or submerge (bath tub, swimming pool, hot tub, etc.) the area.  Pat to dry following your shower.  There is no need for additional dressings other than the steri-strips.  Allow the steri-strips to fall off on their own.  Once the strips have fallen off, you may leave the area undressed.  DO NOT apply lotion/cream/ointment to the area.  The wound must remain dry at all times other than while showering.  Dr. Arlethia Basso or his staff will remove your stiches at your first postop visit and give you additional instructions regarding wound care at that time.  ° °Activity °NO DRIVING FOR 2 WEEKS.  No lifting over 5 pounds (approximately a gallon of milk).  No bending, stooping, squatting or twisting.  No overhead activities.  We encourage you to walk (short distances and often throughout the day) as you can tolerate.  A good rule of thumb is to get up and move once or twice every hour.  You may go up and down stairs carefully.  As you continue to recover, Dr. Autymn Omlor will address and  adjust restrictions to your activities until no further restrictions are needed.  However, until your first postop visit, when Dr. Dainelle Hun can assess your recovery, you are to follow these instructions.  At the end of this document is a tentative outline of activities for up to 1 year.   ° ° ° ° °Medication °You will be discharged from the hospital with medication for pain, spasm, nausea and constipation.  You will be given enough medication to last until your first postop visit in 2 weeks.  Medications WILL NOT BE REFILLED EARLY; therefore, you are to take the medications only as directed.  If you have been given multiple prescriptions, please leave them with your pharmacy.  They can keep them on file for when you need them.  Medications that are lost or stolen WILL NOT be replaced.  We will address the need for continuing certain medications on an individual basis during your postop visit.  We ask that you avoid over the counter anti-inflammatory medications (Advil, Aleve, Motrin) for 3 months.   ° °What you can expect following neck surgery... °It is not uncommon to experience a sore throat or difficulty swallowing following neck surgery.  Cold liquids and soft foods are helpful in soothing this discomfort.  There is no specific diet that you are to follow after surgery, however, there are a few things you   should keep in mind to avoid unneeded discomfort.  Take small bites and eat slowly.  Chew your food thoroughly before swallowing.  ° °It is not uncommon to experience incisional soreness or pain in the back of the neck, shoulders or between the shoulder blades.  These symptoms will slowly begin to resolve as you continue to recover, however, they can last for a few weeks.   ° °It is not uncommon to experience INTERMITTENT arm pain following surgery.  This pain can mimic the arm pain you had prior to surgery.  As long as the pain resolves on its own and is not constant, there is no need to become alarmed.   ° °When To Call °If you experience fever >101F, loss of bowel or bladder control, painful swelling in the lower extremities, constant (unresolving) arm pain.  If you experience any of these symptoms, please call [facility] [phone]. ° °What's Next °As mentioned earlier, you will follow up with Dr. Daniela Hernan in 2 weeks.  At that time, we will likely remove your stitches and discuss additional aspects of your recovery.   ° ° ° ° ° ° ° ° ° ° ° ° ° ° ° ° °ACTIVITY GUIDELINES ANTERIOR CERVICAL DISECTOMY AND FUSION ° °Activity Discharge 2 weeks 6 weeks 3 months 6 months 1 year  °Shower 5 days        °Submerge the wound  no no yes     °Walking outdoors yes       °Lifting 5 lbs yes       °Climbing stairs yes       °Cooking yes       °Car rides (less than 30 minutes) yes       °Car rides (greater than 30 minutes) no varies yes     °Air travel no varies yes     °Short outings (church, visits, etc...) yes       °School no no yes     °Driving a car no no varies yes    °Light upper extremity exercises no no varies yes    °Stationary bike no no yes     °Swimming (no diving) no no no varies yes   °Vacuuming, laundry, mopping no no no varies yes   °Biking outdoors no no no no varies yes  °Light jogging no no no varies yes   °Low impact aerobics no no no varies yes   °Non-contact sports (tennis, golf) no no no varies yes   °Hunting (no tree climbing) no no no varies yes   °Dancing (non-gymnastics) no no no varies yes   °Down-hill skiing (experienced skier) no no no no yes   °Down-hill skiing (novice) no no no no yes   °Cross-country skiing no no no no yes   °Horseback riding (noncompetitive)  no no no no yes   °Horseback riding (competitive) no no no no varies yes  °Gardening/landscaping no no no varies yes   °House repairs no no no varies varies yes  °Lifting up to 50 lbs no no no no varies yes  ° ° ° ° ° ° ° ° °

## 2018-10-21 NOTE — Op Note (Signed)
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  Patient has had prior C5-7 ACDF.  Postoperative diagnosis: Same  Operative procedure: Revision ACDF C4-5  Implant: Medtronic 0 profile Nano lock intervertebral spacer.  7 mm medium lordotic cage locked with 14 mm screws and anterior locking plate.  Allograft:vivogen  Complications: None  Indications: Erica Franklin is a very pleasant active young woman who had a previous ACDF C5-7 and has recently begun having severe radicular right arm pain and neck pain.  Imaging studies demonstrated foraminal stenosis with nerve root irritation as well as adjacent degenerative cervical disc disease.  After attempted conservative management failed to alleviate her pain we elected to move forward with a revision ACDF.  All appropriate risks benefits and alternatives were discussed with the patient and consent was obtained.  Operative report: Patient is brought the operating room placed upon the operating room table.  After successful induction of general anesthesia and endotracheal the patient teds SCDs were applied and the anterior cervical spine was prepped and draped in a standard fashion.  Patient had a prior left-sided approach and so elected to move forward with a right sided approach to the cervical spine.  X-ray was used to identify the C4-5 disc space level and the incision site was marked out on the skin.  I infiltrated the incision with quarter percent Marcaine with epinephrine and made a transverse incision starting at the midline and proceeding to the right.  Sharp dissection was carried out down to the latissimus and the platysma was sharply incised.  This was a standard Smith-Robinson approach to the anterior cervical spine.  I sharply dissected along the medial border of the sternocleidomastoid through the deep cervical fascia and prevertebral fascia.  I mobilized  the esophagus and trachea to the left and identified and protected the carotid sheath with my finger.  I was able to visualize the cervical plate and the I6-9 disc space.  A needle was placed into the 4 5 disc space and an x-ray was used to confirm the level.  Once this was done bipolar cautery was used to mobilize the longus coli muscles from the superior aspect of C4 to the midportion of C5.  Self-retaining Caspar retractor blades were placed underneath the longus coli muscle the endotracheal cuff was deflated and the retractor was expanded and the endotracheal cuff reinflated.  I now had excellent visualization of the C4-5 disc space.  15 blade scalpel was used to perform an annulotomy and pituitary rongeurs were used to remove the bulk of the disc material.  The overhanging osteophyte from the inferior aspect of C4 was resected with a 2 and 3 mm Kerrison punch.  A distraction pin was placed into the body of C4 and through the central hole of the plate at C5.  I distracted the intervertebral space and continue removing the disc material with pituitary Roger and curettes.  I used a 1 mm Kerrison Roger to resect the posterior osteophyte from the C4 and C5 vertebral bodies.  I was then able to get under the uncovertebral joint and resect the osteophyte bilaterally.  Using a fine nerve hook I developed a plane under the posterior longitudinal ligament.  I exploited this with a 1 mm Kerrison Roger and resected the posterior annulus and posterior longitudinal ligament to completely decompress the C4-5 level.  I was able to freely pass my nerve hook under the uncovertebral joints bilaterally and behind the posterior aspect of  the vertebral bodies.  This was confirmed with fluoroscopy.  I rasped the endplates to bleeding subchondral bone and then placed the trial intervertebral spacers.  I elected to use the size 7 mm medium lordotic spacer as it provided the best overall fit and allow the best restoration of  foraminal volume.  The cage was packed with the allograft and malleted to the appropriate depth.  Locking screws were placed.  Both screws had excellent purchase.  The anterior cervical locking plate was then secured to the cage.  The retractors and distraction pins were removed bleeding was controlled with bone wax and the bone holes and bipolar electrocautery.  FloSeal was then placed and then the wound was copiously irrigated to confirm hemostasis.  The trach and esophagus were then returned back to the midline and I closed the platysma with interrupted 2-0 Vicryl suture, and skin with 3-0 Monocryl.  Steri-Strips and dry dressings were applied and the patient was ultimately extubated transfer the PACU without incident.  The end of the case all needle sponge counts were correct.  There were no adverse intraoperative events.

## 2018-10-22 ENCOUNTER — Encounter (HOSPITAL_COMMUNITY): Payer: Self-pay | Admitting: Orthopedic Surgery

## 2018-10-22 NOTE — Anesthesia Postprocedure Evaluation (Signed)
Anesthesia Post Note  Patient: Erica Franklin  Procedure(s) Performed: ANTERIOR CERVICAL DECOMPRESSION/DISCECTOMY FUSION C4-5 (N/A )     Patient location during evaluation: PACU Anesthesia Type: General Level of consciousness: awake and alert Pain management: pain level controlled Vital Signs Assessment: post-procedure vital signs reviewed and stable Respiratory status: spontaneous breathing, nonlabored ventilation, respiratory function stable and patient connected to nasal cannula oxygen Cardiovascular status: blood pressure returned to baseline and stable Postop Assessment: no apparent nausea or vomiting Anesthetic complications: no    Last Vitals:  Vitals:   10/21/18 1458 10/21/18 1910  BP: 127/61 130/65  Pulse: 72 75  Resp: 16 18  Temp: 36.8 C 36.7 C  SpO2: 98% 99%    Last Pain:  Vitals:   10/21/18 1910  TempSrc: Oral  PainSc: 3                  Harrie Cazarez L Torrie Lafavor

## 2018-11-28 ENCOUNTER — Other Ambulatory Visit: Payer: Self-pay | Admitting: Obstetrics & Gynecology

## 2018-12-02 ENCOUNTER — Encounter: Payer: Self-pay | Admitting: Obstetrics & Gynecology

## 2018-12-14 ENCOUNTER — Encounter: Payer: Self-pay | Admitting: Obstetrics & Gynecology

## 2018-12-17 ENCOUNTER — Other Ambulatory Visit: Payer: Self-pay

## 2018-12-20 ENCOUNTER — Encounter: Payer: Self-pay | Admitting: Obstetrics & Gynecology

## 2018-12-20 ENCOUNTER — Other Ambulatory Visit: Payer: Self-pay

## 2018-12-20 ENCOUNTER — Ambulatory Visit (INDEPENDENT_AMBULATORY_CARE_PROVIDER_SITE_OTHER): Payer: Self-pay | Admitting: Obstetrics & Gynecology

## 2018-12-20 VITALS — BP 120/78

## 2018-12-20 DIAGNOSIS — F419 Anxiety disorder, unspecified: Secondary | ICD-10-CM

## 2018-12-20 DIAGNOSIS — N951 Menopausal and female climacteric states: Secondary | ICD-10-CM

## 2018-12-20 MED ORDER — VENLAFAXINE HCL ER 75 MG PO CP24: 75.0000 mg | ORAL_CAPSULE | Freq: Every day | ORAL | 4 refills | Status: AC

## 2018-12-20 MED ORDER — NONFORMULARY OR COMPOUNDED ITEM: 3 refills | Status: AC

## 2018-12-20 NOTE — Progress Notes (Signed)
    Erica Franklin [birth year] [id number]        53 y.o.  [id number]  Divorced.  RP: Hot flushes and anxiety  HPI: Cervical spine surgery, pain resolved slowly post surgery.  Has limitation in right shoulder mobility.  Menopause on no hormone replacement therapy.  No postmenopausal bleeding.  No pelvic pain.  Complains of severe hot flushes.  Also experiences high level of anxiety.  Pain/dryness with IC.  Depressive symptoms with no suicidal ideation on Effexor XL 75 mg daily.   OB History  Gravida Para Term Preterm AB Living  6 3     3 3   SAB TAB Ectopic Multiple Live Births  1   2        # Outcome Date GA Lbr Len/2nd Weight Sex Delivery Anes PTL Lv  6 SAB           5 Ectopic           4 Ectopic           3 Para           2 Para           1 Para             Past medical history,surgical history, problem list, medications, allergies, family history and social history were all reviewed and documented in the EPIC chart.   Directed ROS with pertinent positives and negatives documented in the history of present illness/assessment and plan.  Exam:  Vitals:   12/20/18 0844  BP: 120/78   General appearance:  Normal  Gyn exam deferred   Assessment/Plan:  53 y.o. [id number]   1. Menopausal syndrome (hot flashes) Decision not to start on systemic hormone replacement therapy.  We will try compound Estrogen cream off an applicator twice a week for vaginal dryness and pain with intercourse.  Prescription sent to [facility].  2. Anxiety We will continue on Effexor Exar 75 mg daily.  Increase physical activity to release endorphins.  Other orders - meloxicam (MOBIC) 7.5 MG tablet; Take 7.5 mg by mouth daily. - venlafaxine XR (EFFEXOR XR) 75 MG 24 hr capsule; Take 1 capsule (75 mg total) by mouth daily with breakfast.  Counseling on above issues and coordination of care more than 50% for 25 minutes.  Princess Bruins MD, 8:56 AM 12/20/2018

## 2018-12-26 ENCOUNTER — Encounter: Payer: Self-pay | Admitting: Obstetrics & Gynecology

## 2018-12-26 NOTE — Patient Instructions (Signed)
1. Menopausal syndrome (hot flashes) Decision not to start on systemic hormone replacement therapy.  We will try compound Estrogen cream off an applicator twice a week for vaginal dryness and pain with intercourse.  Prescription sent to [facility].  2. Anxiety We will continue on Effexor Exar 75 mg daily.  Increase physical activity to release endorphins.  Other orders - meloxicam (MOBIC) 7.5 MG tablet; Take 7.5 mg by mouth daily. - venlafaxine XR (EFFEXOR XR) 75 MG 24 hr capsule; Take 1 capsule (75 mg total) by mouth daily with breakfast.  Erica Franklin, it was a pleasure seeing you today!

## 2019-03-14 ENCOUNTER — Telehealth: Payer: Self-pay | Admitting: *Deleted

## 2019-03-14 NOTE — Telephone Encounter (Signed)
Agree with Valacyclovir 1 g PO daily x 5.  #5, refill x 3.

## 2019-03-14 NOTE — Telephone Encounter (Signed)
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 She doesn't have Rx on file to help with this,patient asked if you would be wiling to send Rx in for her? Please advise

## 2019-03-15 ENCOUNTER — Other Ambulatory Visit: Payer: Self-pay

## 2019-03-15 MED ORDER — VALACYCLOVIR HCL 1 G PO TABS: ORAL_TABLET | ORAL | 3 refills | Status: AC

## 2019-03-15 NOTE — Telephone Encounter (Signed)
Spoke with patient and confirmed pharmacy. Rx sent.
